# Patient Record
Sex: Male | Born: 1960
Health system: Southern US, Community
[De-identification: ages and names within clinical notes are randomized; demographics above are authoritative.]

## PROBLEM LIST (undated history)

## (undated) DIAGNOSIS — C61 Malignant neoplasm of prostate: Secondary | ICD-10-CM

## (undated) DIAGNOSIS — E785 Hyperlipidemia, unspecified: Secondary | ICD-10-CM

## (undated) DIAGNOSIS — K5730 Diverticulosis of large intestine without perforation or abscess without bleeding: Secondary | ICD-10-CM

## (undated) DIAGNOSIS — Z8709 Personal history of other diseases of the respiratory system: Secondary | ICD-10-CM

## (undated) DIAGNOSIS — E039 Hypothyroidism, unspecified: Secondary | ICD-10-CM

## (undated) DIAGNOSIS — Z860101 Personal history of adenomatous and serrated colon polyps: Secondary | ICD-10-CM

## (undated) DIAGNOSIS — N401 Enlarged prostate with lower urinary tract symptoms: Secondary | ICD-10-CM

## (undated) DIAGNOSIS — G4700 Insomnia, unspecified: Secondary | ICD-10-CM

## (undated) DIAGNOSIS — J449 Chronic obstructive pulmonary disease, unspecified: Secondary | ICD-10-CM

## (undated) DIAGNOSIS — Z86010 Personal history of colonic polyps: Secondary | ICD-10-CM

## (undated) DIAGNOSIS — K219 Gastro-esophageal reflux disease without esophagitis: Secondary | ICD-10-CM

## (undated) HISTORY — PX: PROSTATE BIOPSY: SHX241

## (undated) HISTORY — PX: COLONOSCOPY: SHX174

## (undated) HISTORY — DX: Malignant neoplasm of prostate: C61

## (undated) HISTORY — DX: Hypothyroidism, unspecified: E03.9

## (undated) HISTORY — DX: Hyperlipidemia, unspecified: E78.5

## (undated) HISTORY — DX: Chronic obstructive pulmonary disease, unspecified: J44.9

---

## 1987-07-31 HISTORY — PX: THORACOTOMY: SUR1349

## 2011-08-15 ENCOUNTER — Ambulatory Visit: Payer: Worker's Compensation

## 2011-08-16 ENCOUNTER — Ambulatory Visit: Payer: Worker's Compensation

## 2016-07-12 ENCOUNTER — Ambulatory Visit (INDEPENDENT_AMBULATORY_CARE_PROVIDER_SITE_OTHER): Payer: 59 | Admitting: Family Medicine

## 2016-07-12 ENCOUNTER — Encounter: Payer: Self-pay | Admitting: Family Medicine

## 2016-07-12 VITALS — BP 112/73 | HR 79 | Temp 97.8°F | Resp 14 | Ht 67.5 in | Wt 159.5 lb

## 2016-07-12 DIAGNOSIS — Z1211 Encounter for screening for malignant neoplasm of colon: Secondary | ICD-10-CM | POA: Diagnosis not present

## 2016-07-12 DIAGNOSIS — G4700 Insomnia, unspecified: Secondary | ICD-10-CM | POA: Insufficient documentation

## 2016-07-12 DIAGNOSIS — E039 Hypothyroidism, unspecified: Secondary | ICD-10-CM | POA: Diagnosis not present

## 2016-07-12 DIAGNOSIS — F5101 Primary insomnia: Secondary | ICD-10-CM

## 2016-07-12 DIAGNOSIS — Z8546 Personal history of malignant neoplasm of prostate: Secondary | ICD-10-CM | POA: Insufficient documentation

## 2016-07-12 DIAGNOSIS — Z130 Encounter for screening for diseases of the blood and blood-forming organs and certain disorders involving the immune mechanism: Secondary | ICD-10-CM

## 2016-07-12 DIAGNOSIS — R7989 Other specified abnormal findings of blood chemistry: Secondary | ICD-10-CM | POA: Diagnosis not present

## 2016-07-12 DIAGNOSIS — C61 Malignant neoplasm of prostate: Secondary | ICD-10-CM | POA: Diagnosis not present

## 2016-07-12 DIAGNOSIS — Z13220 Encounter for screening for lipoid disorders: Secondary | ICD-10-CM

## 2016-07-12 LAB — COMPREHENSIVE METABOLIC PANEL
ALK PHOS: 88 U/L (ref 39–117)
ALT: 15 U/L (ref 0–53)
AST: 16 U/L (ref 0–37)
Albumin: 4.6 g/dL (ref 3.5–5.2)
BUN: 15 mg/dL (ref 6–23)
CO2: 30 mEq/L (ref 19–32)
Calcium: 9.4 mg/dL (ref 8.4–10.5)
Chloride: 103 mEq/L (ref 96–112)
Creatinine, Ser: 1.03 mg/dL (ref 0.40–1.50)
GFR: 79.61 mL/min (ref >60.00)
GLUCOSE: 81 mg/dL (ref 70–99)
POTASSIUM: 4.3 meq/L (ref 3.5–5.1)
SODIUM: 140 meq/L (ref 135–145)
TOTAL PROTEIN: 6.8 g/dL (ref 6.0–8.3)
Total Bilirubin: 1.4 mg/dL — ABNORMAL HIGH (ref 0.2–1.2)

## 2016-07-12 LAB — LIPID PANEL
CHOL/HDL RATIO: 6
Cholesterol: 259 mg/dL — ABNORMAL HIGH (ref 0–200)
HDL: 45.3 mg/dL (ref >39.00)
NONHDL: 213.8
Triglycerides: 258 mg/dL — ABNORMAL HIGH (ref 0.0–149.0)
VLDL: 51.6 mg/dL — AB (ref 0.0–40.0)

## 2016-07-12 LAB — TSH: TSH: 2.88 u[IU]/mL (ref 0.35–4.50)

## 2016-07-12 LAB — PSA: PSA: 4.45 ng/mL — AB (ref 0.10–4.00)

## 2016-07-12 LAB — LDL CHOLESTEROL, DIRECT: Direct LDL: 173 mg/dL

## 2016-07-12 MED ORDER — MIRTAZAPINE 30 MG PO TABS: 30.0000 mg | ORAL_TABLET | Freq: Every day | ORAL | 1 refills | Status: DC

## 2016-07-12 MED ORDER — LEVOTHYROXINE SODIUM 100 MCG PO TABS: 100.0000 ug | ORAL_TABLET | Freq: Every day | ORAL | 1 refills | Status: DC

## 2016-07-12 NOTE — Assessment & Plan Note (Signed)
Stable. Labs today. Refilled Synthroid.

## 2016-07-12 NOTE — Progress Notes (Signed)
Pre visit review using our clinic review tool, if applicable. No additional management support is needed unless otherwise documented below in the visit note. 

## 2016-07-12 NOTE — Assessment & Plan Note (Signed)
New problem. Referring to [facility].

## 2016-07-12 NOTE — Progress Notes (Signed)
Subjective:  Patient ID: Cory [name], male    DOB: 14-Jun-1961  Age: 55 y.o. MRN: [id number]  CC: Establish care  HPI Cory [name] is a 55 y.o. male presents to the clinic today to establish care.Concerns/issues are below.  Prostate cancer  Patient has recently relocated from [place]. He was diagnosed earlier this year following an abnormal PSA.  Patient has yet to undergo treatment.  He is in need of a urologist in her area.  He would like referral today.  He states that he has some difficulty urinating but is otherwise asymptomatic.  Hypothyroidism  Has been stable on Synthroid 100 MCG daily.  He is in need of refill today.  Labs today also.  Insomnia  Stable Remeron.  Needs refill today.  PMH, Surgical Hx, Family Hx, Social History reviewed and updated as below.  Past Medical History:  Diagnosis Date  . Frequent headaches   . Hypothyroidism   . Prostate cancer [facility])    Past Surgical History:  Procedure Laterality Date  . THORACOTOMY Right 1989   Family History  Problem Relation Age of Onset  . Lung cancer Mother   . Hyperlipidemia Mother   . Hypertension Mother    Social History  Substance Use Topics  . Smoking status: Former Research scientist (life sciences)  . Smokeless tobacco: Never Used  . Alcohol use 0.6 oz/week    1 Cans of beer per week   Review of Systems  Genitourinary: Positive for difficulty urinating.  Neurological: Positive for dizziness.  All other systems reviewed and are negative.  Objective:   Today's Vitals: BP 112/73 (BP Location: Left Arm, Patient Position: Sitting, Cuff Size: Normal)   Pulse 79   Temp 97.8 F (36.6 C) (Oral)   Resp 14   Ht 5' 7.5" (1.715 m) Comment: with shoes  Wt 159 lb 8 oz (72.3 kg)   SpO2 97%   BMI 24.61 kg/m   Physical Exam  Constitutional: He is oriented to person, place, and time. He appears well-developed and well-nourished. No distress.  HENT:  Head: Normocephalic and atraumatic.  Nose: Nose normal.    Mouth/Throat: Oropharynx is clear and moist. No oropharyngeal exudate.  Normal TM's bilaterally.   Eyes: Conjunctivae are normal. No scleral icterus.  Neck: Neck supple.  Cardiovascular: Normal rate and regular rhythm.   No murmur heard. Pulmonary/Chest: Effort normal and breath sounds normal. He has no wheezes. He has no rales.  Abdominal: Soft. He exhibits no distension. There is no tenderness. There is no rebound and no guarding.  Musculoskeletal: Normal range of motion. He exhibits no edema.  Lymphadenopathy:    He has no cervical adenopathy.  Neurological: He is alert and oriented to person, place, and time.  Skin: Skin is warm and dry. No rash noted.  Psychiatric:  Flat affect.  Vitals reviewed.  Assessment & Plan:   Problem List Items Addressed This Visit    Prostate cancer ([place]) - Primary    New problem. Referring to [facility].      Relevant Medications   famciclovir (FAMVIR) 500 MG tablet   Other Relevant Orders   Ambulatory referral to Urology   Comprehensive metabolic panel   PSA   Insomnia    Stable on Remeron. Refilled today.      Hypothyroidism    Stable. Labs today. Refilled Synthroid.      Relevant Medications   levothyroxine (SYNTHROID, LEVOTHROID) 100 MCG tablet   Other Relevant Orders   TSH   Colon cancer screening  Patient states these due for colonoscopy (its been 5 years). Requesting a referral today. Referral placed.      Relevant Orders   Ambulatory referral to Gastroenterology    Other Visit Diagnoses    Screening for deficiency anemia       Relevant Orders   CBC   Lipid panel   Screening, lipid          Outpatient Encounter Prescriptions as of 07/12/2016  Medication Sig  . famciclovir (FAMVIR) 500 MG tablet Take 500 mg by mouth daily. Take when needed over the course of 7-days.  Marland Kitchen levothyroxine (SYNTHROID, LEVOTHROID) 100 MCG tablet Take 1 tablet (100 mcg total) by mouth daily before breakfast.  . mirtazapine  (REMERON) 30 MG tablet Take 1 tablet (30 mg total) by mouth at bedtime.  . [DISCONTINUED] levothyroxine (SYNTHROID, LEVOTHROID) 100 MCG tablet Take 100 mcg by mouth daily before breakfast.  . [DISCONTINUED] mirtazapine (REMERON) 30 MG tablet Take 30 mg by mouth at bedtime.   No facility-administered encounter medications on file as of 07/12/2016.     Follow-up: 6 months - 1 year.  [place]

## 2016-07-12 NOTE — Assessment & Plan Note (Signed)
Stable on Remeron. Refilled today.

## 2016-07-12 NOTE — Patient Instructions (Signed)
We will call with the referrals and with your lab results.  Follow up in 6 months to 1 year.  Take care  Dr. Lacinda Axon

## 2016-07-12 NOTE — Assessment & Plan Note (Signed)
Patient states these due for colonoscopy (its been 5 years). Requesting a referral today. Referral placed.

## 2016-07-13 LAB — CBC
HEMATOCRIT: 43.4 % (ref 39.0–52.0)
Hemoglobin: 14.8 g/dL (ref 13.0–17.0)
MCHC: 34.1 g/dL (ref 30.0–36.0)
MCV: 88.7 fl (ref 78.0–100.0)
Platelets: 251 10*3/uL (ref 150.0–400.0)
RBC: 4.89 Mil/uL (ref 4.22–5.81)
RDW: 13.8 % (ref 11.5–15.5)
WBC: 7.1 10*3/uL (ref 4.0–10.5)

## 2016-07-27 ENCOUNTER — Encounter: Payer: Self-pay | Admitting: Radiation Oncology

## 2016-08-02 ENCOUNTER — Telehealth: Payer: Self-pay | Admitting: Gastroenterology

## 2016-08-07 ENCOUNTER — Encounter: Payer: Self-pay | Admitting: Gastroenterology

## 2016-08-15 ENCOUNTER — Ambulatory Visit: Payer: 59

## 2016-08-15 ENCOUNTER — Ambulatory Visit: Payer: 59 | Admitting: Radiation Oncology

## 2016-08-20 ENCOUNTER — Ambulatory Visit
Admission: RE | Admit: 2016-08-20 | Discharge: 2016-08-20 | Disposition: A | Discharge status: Home / Self Care | Payer: 59 | Source: Ambulatory Visit | Attending: Radiation Oncology | Admitting: Radiation Oncology

## 2016-08-20 ENCOUNTER — Encounter: Payer: Self-pay | Admitting: Radiation Oncology

## 2016-08-20 VITALS — BP 125/79 | HR 86 | Temp 97.9°F | Resp 18 | Ht 67.0 in | Wt 163.6 lb

## 2016-08-20 DIAGNOSIS — Z9889 Other specified postprocedural states: Secondary | ICD-10-CM | POA: Diagnosis not present

## 2016-08-20 DIAGNOSIS — Z87891 Personal history of nicotine dependence: Secondary | ICD-10-CM | POA: Diagnosis not present

## 2016-08-20 DIAGNOSIS — R3911 Hesitancy of micturition: Secondary | ICD-10-CM | POA: Diagnosis not present

## 2016-08-20 DIAGNOSIS — Z809 Family history of malignant neoplasm, unspecified: Secondary | ICD-10-CM | POA: Diagnosis not present

## 2016-08-20 DIAGNOSIS — Z510 Encounter for antineoplastic radiation therapy: Secondary | ICD-10-CM | POA: Insufficient documentation

## 2016-08-20 DIAGNOSIS — Z8249 Family history of ischemic heart disease and other diseases of the circulatory system: Secondary | ICD-10-CM | POA: Diagnosis not present

## 2016-08-20 DIAGNOSIS — C61 Malignant neoplasm of prostate: Secondary | ICD-10-CM

## 2016-08-20 DIAGNOSIS — E039 Hypothyroidism, unspecified: Secondary | ICD-10-CM | POA: Insufficient documentation

## 2016-08-20 DIAGNOSIS — Z801 Family history of malignant neoplasm of trachea, bronchus and lung: Secondary | ICD-10-CM | POA: Diagnosis not present

## 2016-08-20 NOTE — Progress Notes (Signed)
GU Location of Tumor / Histology: Prostatic adenocarcinoma  If Prostate Cancer, Gleason Score is (3 + 4) and PSA is (5.56)  Lajean Saver was referred by Dr. Lacinda Axon, new PCP, to Dr. Tresa Moore. Patient was counseled by Dr. Glenford Peers with [facility] in [place] following his 01/2016 biopsy and has even been seen by a radiation oncologist. Patient is new to [facility] area and is in the process of establishing medical care.  Biopsies of prostate (if applicable) revealed:    Past/Anticipated interventions by urology, if any: biopsy and counseling. Patient most interested in seeds. Patient certain he doesn't want his prostate removed.  Past/Anticipated interventions by medical oncology, if any: no  Weight changes, if any: no  Bowel/Bladder complaints, if any: hard to postpone urination. IPSS 16. Denies dysuria, hematuria, or leakage.  Nausea/Vomiting, if any: no  Pain issues, if any:  no  SAFETY ISSUES:  Prior radiation? no  Pacemaker/ICD? no  Possible current pregnancy? no  Is the patient on methotrexate? no  Current Complaints / other details:  56 year old male. Legally separated. Prostate volume 44 cc.

## 2016-08-20 NOTE — Progress Notes (Signed)
Radiation Oncology         [phone] ________________________________  Initial Outpatient Consultation  Name: Cory [name] MRN: [id number]  Date: 08/20/2016  DOB: [birth year]  [id number] Bing Neighbors, DO  Alexis Frock, MD   REFERRING PHYSICIAN: Alexis Frock, MD  DIAGNOSIS: 56 y.o. gentleman with stage T2a adenocarcinoma of the prostate with a Gleason's score of 3+4 and a PSA of 5.56    ICD-9-CM ICD-10-CM   1. Prostate cancer ([place]) [place] is a 56 y.o. gentleman.  He was noted to have an elevated PSA of 5.56 by , Dr. Wallace Going with [facility] in [place]. The patient proceeded to transrectal ultrasound with 12 biopsies of the prostate on 01/30/16 in [place].  The prostate volume measured 44 cc.  Out of 12 core biopsies,4 were positive.  The maximum Gleason score was 3+4, and this was seen in right mid, right lateral mid, right apex, and right lateral apex. He has since moved, and his new PCP is Dr. Lacinda Axon. Accordingly, he was referred for evaluation in urology by Dr. Tresa Moore on 07/26/16,  digital rectal examination was performed at that time revealing nodularity in the right apex of the prostate.    The patient reviewed the biopsy results with his urologist and he has kindly been referred today for discussion of potential radiation treatment options.   PREVIOUS RADIATION THERAPY: No  PAST MEDICAL HISTORY:  has a past medical history of Hypothyroidism and Prostate cancer ([place]).    PAST SURGICAL HISTORY: Past Surgical History:  Procedure Laterality Date  . PROSTATE BIOPSY    . THORACOTOMY Right 1989    FAMILY HISTORY: family history includes Cancer in his sister; Hyperlipidemia in his mother; Hypertension in his mother; Lung cancer in his mother.  SOCIAL HISTORY:  reports that he quit smoking about 3 years ago. His smoking use included Cigarettes. He has a 35.00 pack-year smoking history. He has never used smokeless tobacco. He  reports that he drinks about 0.6 oz of alcohol per week . He reports that he does not use drugs.  ALLERGIES: Patient has no known allergies.  MEDICATIONS:  Current Outpatient Prescriptions  Medication Sig Dispense Refill  . levothyroxine (SYNTHROID, LEVOTHROID) 100 MCG tablet Take 1 tablet (100 mcg total) by mouth daily before breakfast. 90 tablet 1  . mirtazapine (REMERON) 30 MG tablet Take 1 tablet (30 mg total) by mouth at bedtime. 90 tablet 1  . famciclovir (FAMVIR) 500 MG tablet Take 500 mg by mouth daily. Take when needed over the course of 7-days.     No current facility-administered medications for this encounter.     REVIEW OF SYSTEMS:  A 15 point review of systems is documented in the electronic medical record. This was obtained by the nursing staff. However, I reviewed this with the patient to discuss relevant findings and make appropriate changes.  Pertinent items are noted in HPI.Marland Kitchen He denies weight changes, nausea/vomiting, or pain at this time. The patient completed an IPSS and IIEF questionnaire.  His IPSS score was 16 indicating moderate urinary outflow obstructive symptoms.  He notes difficulty postponing urination. He denies dysuria, hematuria, or leakage. He indicated that his erectile function is able to complete sexual activity most of the time.   PHYSICAL EXAM: This patient is in no acute distress.  He is alert and oriented.   height is 5\' 7"  (1.702 m) and weight is 163 lb 9.6 oz (74.2 kg). His oral temperature is  97.9 F (36.6 C). His blood pressure is 125/79 and his pulse is 86. His respiration is 18 and oxygen saturation is 100%.  He exhibits no respiratory distress or labored breathing.  He appears neurologically intact.  His mood is pleasant.  His affect is appropriate.  Please note the digital rectal exam findings described above.  KPS = 100  100 - Normal; no complaints; no evidence of disease. 90   - Able to carry on normal activity; minor signs or symptoms of  disease. 80   - Normal activity with effort; some signs or symptoms of disease. 10   - Cares for self; unable to carry on normal activity or to do active work. 60   - Requires occasional assistance, but is able to care for most of his personal needs. 50   - Requires considerable assistance and frequent medical care. 56   - Disabled; requires special care and assistance. 35   - Severely disabled; hospital admission is indicated although death not imminent. 36   - Very sick; hospital admission necessary; active supportive treatment necessary. 10   - Moribund; fatal processes progressing rapidly. 0     - Dead  Karnofsky DA, Abelmann [place], Craver LS and Burchenal [facility] [phone]) The use of the nitrogen mustards in the palliative treatment of carcinoma: with particular reference to bronchogenic carcinoma Cancer 1 634-56   LABORATORY DATA:  Lab Results  Component Value Date   WBC 7.1 07/12/2016   HGB 14.8 07/12/2016   HCT 43.4 07/12/2016   MCV 88.7 07/12/2016   PLT 251.0 07/12/2016   Lab Results  Component Value Date   NA 140 07/12/2016   K 4.3 07/12/2016   CL 103 07/12/2016   CO2 30 07/12/2016   Lab Results  Component Value Date   ALT 15 07/12/2016   AST 16 07/12/2016   ALKPHOS 88 07/12/2016   BILITOT 1.4 (H) 07/12/2016     RADIOGRAPHY: No results found.    IMPRESSION: This gentleman is 56 years old with stage T2a adenocarcinoma of the prostate with a Gleason's score of 3+4 and a PSA of 5.56.  His T-Stage, Gleason's Score, and PSA put him into the intermediate risk group.   He falls into a select sub-set of patients with intermediate risk disease who are eligible for seed implant with primary Gleason grade of 3 and less than half of one lobe positive for Gleason's 7 disease.  Accordingly he is eligible for a variety of potential treatment options including prostatectomy, external beam radiation, or external beam radiation.  PLAN:Today I reviewed the findings and workup thus far.  We  discussed the natural history of prostate cancer.  We reviewed the the implications of T-stage, Gleason's Score, and PSA on decision-making and outcomes in prostate cancer.  We discussed radiation treatment in the management of prostate cancer with regard to the logistics and delivery of external beam radiation treatment as well as the logistics and delivery of prostate brachytherapy.  We compared and contrasted each of these approaches and also compared these against prostatectomy.  The patient expressed interest in prostate brachytherapy.    The patient would like to proceed with prostate brachytherapy.  I will share my findings with Dr. Tresa Moore and move forward with scheduling the procedure in the near future.     I enjoyed meeting with him today, and will look forward to participating in the care of this very nice gentleman.  I spent 40 minutes face to face with the patient and more  than 50% of that time was spent in counseling and/or coordination of care.   ------------------------------------------------  Cory [name], M.D.  This document serves as a record of services personally performed by Tyler Pita, MD. It was created on his behalf by Bethann Humble, a trained medical scribe. The creation of this record is based on the scribe's personal observations and the provider's statements to them. This document has been checked and approved by the attending provider.

## 2016-08-20 NOTE — Progress Notes (Signed)
See progress note under physician encounter. 

## 2016-08-22 ENCOUNTER — Telehealth: Payer: Self-pay | Admitting: *Deleted

## 2016-08-22 NOTE — Telephone Encounter (Signed)
Called patient to inform of pre-seed appt. on 08-31-16, spoke with patient and he is aware of these appts.

## 2016-08-30 ENCOUNTER — Telehealth: Payer: Self-pay | Admitting: *Deleted

## 2016-08-30 DIAGNOSIS — C61 Malignant neoplasm of prostate: Secondary | ICD-10-CM

## 2016-08-30 HISTORY — DX: Malignant neoplasm of prostate: C61

## 2016-08-30 NOTE — Telephone Encounter (Signed)
Called patient to remind of pre-seed appts. And his implant, spoke with patient and he is aware of these appts.

## 2016-08-31 ENCOUNTER — Ambulatory Visit
Admission: RE | Admit: 2016-08-31 | Discharge: 2016-08-31 | Disposition: A | Discharge status: Home / Self Care | Payer: 59 | Source: Ambulatory Visit | Attending: Radiation Oncology | Admitting: Radiation Oncology

## 2016-08-31 ENCOUNTER — Encounter (HOSPITAL_BASED_OUTPATIENT_CLINIC_OR_DEPARTMENT_OTHER)
Admission: RE | Admit: 2016-08-31 | Discharge: 2016-08-31 | Disposition: A | Discharge status: Home / Self Care | Payer: 59 | Source: Ambulatory Visit | Attending: Urology | Admitting: Urology

## 2016-08-31 ENCOUNTER — Encounter: Payer: Self-pay | Admitting: Medical Oncology

## 2016-08-31 ENCOUNTER — Ambulatory Visit (HOSPITAL_BASED_OUTPATIENT_CLINIC_OR_DEPARTMENT_OTHER)
Admission: RE | Admit: 2016-08-31 | Discharge: 2016-08-31 | Disposition: A | Discharge status: Home / Self Care | Payer: 59 | Source: Ambulatory Visit | Attending: Urology | Admitting: Urology

## 2016-08-31 DIAGNOSIS — Z01818 Encounter for other preprocedural examination: Secondary | ICD-10-CM | POA: Diagnosis not present

## 2016-08-31 DIAGNOSIS — J984 Other disorders of lung: Secondary | ICD-10-CM | POA: Insufficient documentation

## 2016-08-31 DIAGNOSIS — C61 Malignant neoplasm of prostate: Secondary | ICD-10-CM

## 2016-08-31 DIAGNOSIS — Z510 Encounter for antineoplastic radiation therapy: Secondary | ICD-10-CM | POA: Diagnosis not present

## 2016-08-31 NOTE — Progress Notes (Signed)
Introduced myself as prostate navigator and my role. I gave him my business card and asked him to call me with any questions or concerns. He saw Dr. Tammi Klippel for consult 08/20/16 and chose brachytherapy for treatment. He is scheduled for seed implant 10/18/16.

## 2016-08-31 NOTE — Progress Notes (Signed)
  Radiation Oncology         [phone] ________________________________  Name: Cory [name] MRN: [id number]  Date: 08/31/2016  DOB: [birth year]  SIMULATION AND TREATMENT PLANNING NOTE PUBIC ARCH STUDY  [id number] Bing Neighbors, DO  Alexis Frock, MD  DIAGNOSIS: 56 y.o. gentleman with stage T2a adenocarcinoma of the prostate with a Gleason's score of 3+4 and a PSA of 5.56   No diagnosis found.  COMPLEX SIMULATION:  The patient presented today for evaluation for possible prostate seed implant. He was brought to the radiation planning suite and placed supine on the CT couch. A 3-dimensional image study set was obtained in upload to the planning computer. There, on each axial slice, I contoured the prostate gland. Then, using three-dimensional radiation planning tools I reconstructed the prostate in view of the structures from the transperineal needle pathway to assess for possible pubic arch interference. In doing so, I did not appreciate any pubic arch interference. Also, the patient's prostate volume was estimated based on the drawn structure. The volume was 36 cc.  Their transrectal ultrasound volume was 44 cc. Given the pubic arch appearance and prostate volume, patient remains a good candidate to proceed with prostate seed implant. Today, he freely provided informed written consent to proceed.    PLAN: The patient will undergo prostate seed implant.   Sheral Apley Tammi Klippel, M.D.  and  Freeman Caldron PA-C     This document serves as a record of services personally performed by Tyler Pita, MD. It was created on his behalf by Arlyce Harman, a trained medical scribe. The creation of this record is based on the scribe's personal observations and the provider's statements to them. This document has been checked and approved by the attending provider.

## 2016-09-03 ENCOUNTER — Other Ambulatory Visit: Payer: Self-pay | Admitting: Urology

## 2016-09-20 ENCOUNTER — Encounter: Payer: Self-pay | Admitting: Family Medicine

## 2016-09-21 ENCOUNTER — Other Ambulatory Visit: Payer: Self-pay | Admitting: Family Medicine

## 2016-09-21 MED ORDER — FAMCICLOVIR 500 MG PO TABS: 500.0000 mg | ORAL_TABLET | Freq: Two times a day (BID) | ORAL | 0 refills | Status: DC

## 2016-09-25 ENCOUNTER — Telehealth: Payer: Self-pay

## 2016-09-25 ENCOUNTER — Ambulatory Visit (AMBULATORY_SURGERY_CENTER): Payer: Self-pay

## 2016-09-25 VITALS — Ht 67.0 in | Wt 162.4 lb

## 2016-09-25 DIAGNOSIS — Z86010 Personal history of colonic polyps: Secondary | ICD-10-CM

## 2016-09-25 MED ORDER — NA SULFATE-K SULFATE-MG SULF 17.5-3.13-1.6 GM/177ML PO SOLN: ORAL | 0 refills | Status: DC

## 2016-09-25 NOTE — Telephone Encounter (Signed)
No that's fine I would direct book. It's better to have the colonoscopy done prior to his therapy, should work out better. Thanks

## 2016-09-25 NOTE — Telephone Encounter (Signed)
Dr Cory [name], This pt is scheduled for his colon on 10/09/16. He has a history of prostate cancer which is newly diagnosed. He is scheduled for radioactive seed placement on 10/19/16. Does pt need an OV first with you or is a direct colon ok? Please advise. Thanks

## 2016-09-25 NOTE — Progress Notes (Signed)
Per pt, no allergies to soy or egg products.Pt not taking any weight loss meds or using  O2 at home. 

## 2016-09-25 NOTE — Telephone Encounter (Signed)
Spoke with pt and informed him it was OK to proceed with the colon on 10/09/16 per Dr Havery Moros. Pt was advised to call if he had further questions.

## 2016-09-26 ENCOUNTER — Encounter: Payer: Self-pay | Admitting: Gastroenterology

## 2016-10-09 ENCOUNTER — Encounter: Payer: Self-pay | Admitting: Gastroenterology

## 2016-10-09 ENCOUNTER — Ambulatory Visit (AMBULATORY_SURGERY_CENTER): Payer: 59 | Admitting: Gastroenterology

## 2016-10-09 VITALS — BP 129/81 | HR 62 | Temp 96.8°F | Resp 13 | Ht 67.0 in | Wt 162.0 lb

## 2016-10-09 DIAGNOSIS — D126 Benign neoplasm of colon, unspecified: Secondary | ICD-10-CM | POA: Diagnosis not present

## 2016-10-09 DIAGNOSIS — D123 Benign neoplasm of transverse colon: Secondary | ICD-10-CM

## 2016-10-09 DIAGNOSIS — Z1211 Encounter for screening for malignant neoplasm of colon: Secondary | ICD-10-CM | POA: Diagnosis not present

## 2016-10-09 DIAGNOSIS — Z86010 Personal history of colonic polyps: Secondary | ICD-10-CM | POA: Diagnosis not present

## 2016-10-09 DIAGNOSIS — D127 Benign neoplasm of rectosigmoid junction: Secondary | ICD-10-CM

## 2016-10-09 DIAGNOSIS — K635 Polyp of colon: Secondary | ICD-10-CM | POA: Diagnosis not present

## 2016-10-09 MED ORDER — SODIUM CHLORIDE 0.9 % IV SOLN: 500.0000 mL | INTRAVENOUS | Status: DC

## 2016-10-09 NOTE — Progress Notes (Signed)
A and O x3. Report to RN. Tolerated MAC anesthesia well.

## 2016-10-09 NOTE — Op Note (Signed)
[place] Patient Name: Cory [name] Procedure Date: 10/09/2016 9:02 AM MRN: [id number] Endoscopist: Remo Lipps P. Jolynn Bajorek MD, MD Age: 56 Referring MD:  Date of Birth: January 13, 1961 Gender: Male Account #: [id number] Procedure:                Colonoscopy Indications:              Surveillance: Personal history of adenomatous                            polyps on last colonoscopy > 5 years ago Medicines:                Monitored Anesthesia Care Procedure:                Pre-Anesthesia Assessment:                           - Prior to the procedure, a History and Physical                            was performed, and patient medications and                            allergies were reviewed. The patient's tolerance of                            previous anesthesia was also reviewed. The risks                            and benefits of the procedure and the sedation                            options and risks were discussed with the patient.                            All questions were answered, and informed consent                            was obtained. Prior Anticoagulants: The patient has                            taken no previous anticoagulant or antiplatelet                            agents. ASA Grade Assessment: II - A patient with                            mild systemic disease. After reviewing the risks                            and benefits, the patient was deemed in                            satisfactory condition to undergo the procedure.  After obtaining informed consent, the colonoscope                            was passed under direct vision. Throughout the                            procedure, the patient's blood pressure, pulse, and                            oxygen saturations were monitored continuously. The                            Colonoscope was introduced through the anus and                            advanced to the the  cecum, identified by                            appendiceal orifice and ileocecal valve. The                            colonoscopy was performed without difficulty. The                            patient tolerated the procedure well. The quality                            of the bowel preparation was good. The ileocecal                            valve, appendiceal orifice, and rectum were                            photographed. Scope In: 9:04:54 AM Scope Out: 9:23:09 AM Scope Withdrawal Time: 0 hours 16 minutes 25 seconds  Total Procedure Duration: 0 hours 18 minutes 15 seconds  Findings:                 The perianal and digital rectal examinations were                            normal.                           A 4 mm polyp was found in the transverse colon. The                            polyp was sessile. The polyp was removed with a                            cold snare. Resection and retrieval were complete.                           A 5 mm polyp was found in the recto-sigmoid colon.  The polyp was flat. The polyp was removed with a                            cold snare. Resection and retrieval were complete.                           Multiple medium-mouthed diverticula were found in                            the left colon.                           Internal hemorrhoids were found during                            retroflexion. The hemorrhoids were small.                           The exam was otherwise without abnormality. Complications:            No immediate complications. Estimated blood loss:                            Minimal. Estimated Blood Loss:     Estimated blood loss was minimal. Impression:               - One 4 mm polyp in the transverse colon, removed                            with a cold snare. Resected and retrieved.                           - One 5 mm polyp at the recto-sigmoid colon,                            removed with a  cold snare. Resected and retrieved.                           - Diverticulosis in the left colon.                           - Internal hemorrhoids.                           - The examination was otherwise normal. Recommendation:           - Patient has a contact number available for                            emergencies. The signs and symptoms of potential                            delayed complications were discussed with the                            patient. Return to normal activities  tomorrow.                            Written discharge instructions were provided to the                            patient.                           - Resume previous diet.                           - Continue present medications.                           - No ibuprofen, naproxen, or other non-steroidal                            anti-inflammatory drugs for 2 weeks after polyp                            removal.                           - Await pathology results.                           - Repeat colonoscopy is recommended for                            surveillance. The colonoscopy date will be                            determined after pathology results from today's                            exam become available for review. Remo Lipps P. Wileen Duncanson MD, MD 10/09/2016 9:27:08 AM This report has been signed electronically.

## 2016-10-09 NOTE — Progress Notes (Signed)
Called to room to assist during endoscopic procedure.  Patient ID and intended procedure confirmed with present staff. Received instructions for my participation in the procedure from the performing physician.  

## 2016-10-09 NOTE — Patient Instructions (Signed)
Handouts given on diverticulosis, hemorrhoids, and polyps   YOU HAD AN ENDOSCOPIC PROCEDURE TODAY: Refer to the procedure report and other information in the discharge instructions given to you for any specific questions about what was found during the examination. If this information does not answer your questions, please call [facility] office at [phone] to clarify.   YOU SHOULD EXPECT: Some feelings of bloating in the abdomen. Passage of more gas than usual. Walking can help get rid of the air that was put into your GI tract during the procedure and reduce the bloating. If you had a lower endoscopy (such as a colonoscopy or flexible sigmoidoscopy) you may notice spotting of blood in your stool or on the toilet paper. Some abdominal soreness may be present for a day or two, also.  DIET: Your first meal following the procedure should be a light meal and then it is ok to progress to your normal diet. A half-sandwich or bowl of soup is an example of a good first meal. Heavy or fried foods are harder to digest and may make you feel nauseous or bloated. Drink plenty of fluids but you should avoid alcoholic beverages for 24 hours. If you had a esophageal dilation, please see attached instructions for diet.    ACTIVITY: Your care partner should take you home directly after the procedure. You should plan to take it easy, moving slowly for the rest of the day. You can resume normal activity the day after the procedure however YOU SHOULD NOT DRIVE, use power tools, machinery or perform tasks that involve climbing or major physical exertion for 24 hours (because of the sedation medicines used during the test).   SYMPTOMS TO REPORT IMMEDIATELY: A gastroenterologist can be reached at any hour. Please call [phone]  for any of the following symptoms:  Following lower endoscopy (colonoscopy, flexible sigmoidoscopy) Excessive amounts of blood in the stool  Significant tenderness, worsening of abdominal pains   Swelling of the abdomen that is new, acute  Fever of 100 or higher   FOLLOW UP:  If any biopsies were taken you will be contacted by phone or by letter within the next 1-3 weeks. Call [phone]  if you have not heard about the biopsies in 3 weeks.  Please also call with any specific questions about appointments or follow up tests.

## 2016-10-10 ENCOUNTER — Telehealth: Payer: Self-pay | Admitting: *Deleted

## 2016-10-10 ENCOUNTER — Telehealth: Payer: Self-pay

## 2016-10-10 NOTE — Telephone Encounter (Signed)
  Follow up Call-  Call back number 10/09/2016  Post procedure Call Back phone  # [phone]  Permission to leave phone message Yes  Some recent data might be hidden   Left message

## 2016-10-10 NOTE — Telephone Encounter (Signed)
  Follow up Call-  Call back number 10/09/2016  Post procedure Call Back phone  # [phone]  Permission to leave phone message Yes  Some recent data might be hidden     Patient questions:  Do you have a fever, pain , or abdominal swelling? No. Pain Score  0 *  Have you tolerated food without any problems? Yes.    Have you been able to return to your normal activities? Yes.    Do you have any questions about your discharge instructions: Diet   No. Medications  No. Follow up visit  No.  Do you have questions or concerns about your Care? No.  Actions: * If pain score is 4 or above: No action needed, pain <4.

## 2016-10-11 ENCOUNTER — Telehealth: Payer: Self-pay | Admitting: *Deleted

## 2016-10-11 NOTE — Telephone Encounter (Signed)
Called patient to remind of labs for 10-12-16 for implant on 10-19-16, spoke with patient and he is aware of this appt.

## 2016-10-12 ENCOUNTER — Encounter (HOSPITAL_BASED_OUTPATIENT_CLINIC_OR_DEPARTMENT_OTHER): Payer: Self-pay | Admitting: *Deleted

## 2016-10-12 DIAGNOSIS — Z87891 Personal history of nicotine dependence: Secondary | ICD-10-CM | POA: Diagnosis not present

## 2016-10-12 DIAGNOSIS — Z8546 Personal history of malignant neoplasm of prostate: Secondary | ICD-10-CM | POA: Diagnosis not present

## 2016-10-12 DIAGNOSIS — R972 Elevated prostate specific antigen [PSA]: Secondary | ICD-10-CM | POA: Diagnosis not present

## 2016-10-12 LAB — COMPREHENSIVE METABOLIC PANEL
ALBUMIN: 4.4 g/dL (ref 3.5–5.0)
ALT: 14 U/L — ABNORMAL LOW (ref 17–63)
ANION GAP: 5 (ref 5–15)
AST: 18 U/L (ref 15–41)
Alkaline Phosphatase: 76 U/L (ref 38–126)
BUN: 15 mg/dL (ref 6–20)
CO2: 25 mmol/L (ref 22–32)
Calcium: 9.1 mg/dL (ref 8.9–10.3)
Chloride: 108 mmol/L (ref 101–111)
Creatinine, Ser: 1.1 mg/dL (ref 0.61–1.24)
GFR calc Af Amer: >60 mL/min (ref >60)
GFR calc non Af Amer: >60 mL/min (ref >60)
GLUCOSE: 87 mg/dL (ref 65–99)
POTASSIUM: 4 mmol/L (ref 3.5–5.1)
SODIUM: 138 mmol/L (ref 135–145)
TOTAL PROTEIN: 7.3 g/dL (ref 6.5–8.1)
Total Bilirubin: 1.2 mg/dL (ref 0.3–1.2)

## 2016-10-12 LAB — CBC
HCT: 41.9 % (ref 39.0–52.0)
Hemoglobin: 14.4 g/dL (ref 13.0–17.0)
MCH: 30.1 pg (ref 26.0–34.0)
MCHC: 34.4 g/dL (ref 30.0–36.0)
MCV: 87.5 fL (ref 78.0–100.0)
Platelets: 250 10*3/uL (ref 150–400)
RBC: 4.79 MIL/uL (ref 4.22–5.81)
RDW: 13.4 % (ref 11.5–15.5)
WBC: 5.7 10*3/uL (ref 4.0–10.5)

## 2016-10-12 LAB — PROTIME-INR
INR: 0.98
Prothrombin Time: 13 seconds (ref 11.4–15.2)

## 2016-10-12 LAB — APTT: aPTT: 31 seconds (ref 24–36)

## 2016-10-12 NOTE — Progress Notes (Signed)
NPO AFTER MN WITH EXCEPTION CLEAR LIQUIDS UNTIL 0800 (NO CREAM/ MILK PRODUCTS).  ARRIVE AT 1230. LAB WORK DONE TODAY.  CURRENT CXR AND EKG IN CHART AND EPIC.  WILL TAKE DO FLEET ENEMA AM DOS.

## 2016-10-15 ENCOUNTER — Encounter: Payer: Self-pay | Admitting: Gastroenterology

## 2016-10-18 ENCOUNTER — Telehealth: Payer: Self-pay | Admitting: *Deleted

## 2016-10-18 NOTE — Telephone Encounter (Signed)
CALLED PATIENT TO REMIND OF IMPLANT FOR 10-19-16, SPOKE WITH PATIENT AND HE IS AWARE OF THIS IMPLANT

## 2016-10-19 ENCOUNTER — Ambulatory Visit (HOSPITAL_COMMUNITY): Payer: 59

## 2016-10-19 ENCOUNTER — Other Ambulatory Visit: Payer: Self-pay | Admitting: Family Medicine

## 2016-10-19 ENCOUNTER — Ambulatory Visit (HOSPITAL_BASED_OUTPATIENT_CLINIC_OR_DEPARTMENT_OTHER)
Admission: RE | Admit: 2016-10-19 | Discharge: 2016-10-19 | Disposition: A | Discharge status: Home / Self Care | Payer: 59 | Source: Ambulatory Visit | Attending: Urology | Admitting: Urology

## 2016-10-19 ENCOUNTER — Encounter (HOSPITAL_BASED_OUTPATIENT_CLINIC_OR_DEPARTMENT_OTHER)
Admission: RE | Disposition: A | Discharge status: Home / Self Care | Payer: Self-pay | Source: Ambulatory Visit | Attending: Urology

## 2016-10-19 ENCOUNTER — Ambulatory Visit (HOSPITAL_BASED_OUTPATIENT_CLINIC_OR_DEPARTMENT_OTHER): Payer: 59 | Admitting: Certified Registered"

## 2016-10-19 ENCOUNTER — Encounter (HOSPITAL_BASED_OUTPATIENT_CLINIC_OR_DEPARTMENT_OTHER): Payer: Self-pay

## 2016-10-19 DIAGNOSIS — Z87891 Personal history of nicotine dependence: Secondary | ICD-10-CM | POA: Insufficient documentation

## 2016-10-19 DIAGNOSIS — R972 Elevated prostate specific antigen [PSA]: Secondary | ICD-10-CM | POA: Insufficient documentation

## 2016-10-19 DIAGNOSIS — Z01818 Encounter for other preprocedural examination: Secondary | ICD-10-CM

## 2016-10-19 DIAGNOSIS — Z8546 Personal history of malignant neoplasm of prostate: Secondary | ICD-10-CM | POA: Insufficient documentation

## 2016-10-19 HISTORY — DX: Benign prostatic hyperplasia with lower urinary tract symptoms: N40.1

## 2016-10-19 HISTORY — PX: CYSTOSCOPY: SHX5120

## 2016-10-19 HISTORY — PX: RADIOACTIVE SEED IMPLANT: SHX5150

## 2016-10-19 HISTORY — DX: Personal history of adenomatous and serrated colon polyps: Z86.0101

## 2016-10-19 HISTORY — DX: Insomnia, unspecified: G47.00

## 2016-10-19 HISTORY — DX: Personal history of other diseases of the respiratory system: Z87.09

## 2016-10-19 HISTORY — DX: Diverticulosis of large intestine without perforation or abscess without bleeding: K57.30

## 2016-10-19 HISTORY — DX: Personal history of colonic polyps: Z86.010

## 2016-10-19 SURGERY — INSERTION, RADIATION SOURCE, PROSTATE
Anesthesia: General | Site: Prostate

## 2016-10-19 MED ORDER — GENTAMICIN SULFATE 40 MG/ML IJ SOLN
80.0000 mg | Freq: Once | INTRAVENOUS | Status: DC
  Filled 2016-10-19: qty 2

## 2016-10-19 MED ORDER — FENTANYL CITRATE (PF) 100 MCG/2ML IJ SOLN
INTRAMUSCULAR | Status: DC | PRN
  Administered 2016-10-19: 50 ug via INTRAVENOUS
  Administered 2016-10-19 (×3): 25 ug via INTRAVENOUS

## 2016-10-19 MED ORDER — FENTANYL CITRATE (PF) 100 MCG/2ML IJ SOLN
INTRAMUSCULAR | Status: AC
  Filled 2016-10-19: qty 2

## 2016-10-19 MED ORDER — TRAMADOL HCL 50 MG PO TABS: 50.0000 mg | ORAL_TABLET | Freq: Four times a day (QID) | ORAL | 0 refills | Status: DC | PRN

## 2016-10-19 MED ORDER — ONDANSETRON HCL 4 MG/2ML IJ SOLN
INTRAMUSCULAR | Status: DC | PRN
  Administered 2016-10-19: 4 mg via INTRAVENOUS

## 2016-10-19 MED ORDER — IOHEXOL 300 MG/ML  SOLN
INTRAMUSCULAR | Status: DC | PRN
  Administered 2016-10-19: 7 mL

## 2016-10-19 MED ORDER — SODIUM CHLORIDE 0.9 % IV SOLN
INTRAVENOUS | Status: DC | PRN
  Administered 2016-10-19: 1000 mL

## 2016-10-19 MED ORDER — DEXAMETHASONE SODIUM PHOSPHATE 4 MG/ML IJ SOLN
INTRAMUSCULAR | Status: DC | PRN
  Administered 2016-10-19: 10 mg via INTRAVENOUS

## 2016-10-19 MED ORDER — PROPOFOL 10 MG/ML IV BOLUS
INTRAVENOUS | Status: AC
  Filled 2016-10-19: qty 20

## 2016-10-19 MED ORDER — FENTANYL CITRATE (PF) 100 MCG/2ML IJ SOLN
25.0000 ug | INTRAMUSCULAR | Status: DC | PRN
  Administered 2016-10-19: 50 ug via INTRAVENOUS
  Filled 2016-10-19: qty 1

## 2016-10-19 MED ORDER — TAMSULOSIN HCL 0.4 MG PO CAPS: 0.4000 mg | ORAL_CAPSULE | Freq: Every day | ORAL | 11 refills | Status: DC | PRN

## 2016-10-19 MED ORDER — TAMSULOSIN HCL 0.4 MG PO CAPS
0.4000 mg | ORAL_CAPSULE | Freq: Every day | ORAL | Status: DC
  Administered 2016-10-19: 0.4 mg via ORAL
  Filled 2016-10-19: qty 1

## 2016-10-19 MED ORDER — MIDAZOLAM HCL 2 MG/2ML IJ SOLN
INTRAMUSCULAR | Status: AC
Start: 2016-10-19 — End: 2016-10-19
  Filled 2016-10-19: qty 2

## 2016-10-19 MED ORDER — LACTATED RINGERS IV SOLN
INTRAVENOUS | Status: DC
  Administered 2016-10-19 (×2): via INTRAVENOUS
  Filled 2016-10-19: qty 1000

## 2016-10-19 MED ORDER — LIDOCAINE 2% (20 MG/ML) 5 ML SYRINGE
INTRAMUSCULAR | Status: DC | PRN
  Administered 2016-10-19: 80 mg via INTRAVENOUS

## 2016-10-19 MED ORDER — MIDAZOLAM HCL 5 MG/5ML IJ SOLN
INTRAMUSCULAR | Status: DC | PRN
  Administered 2016-10-19: 2 mg via INTRAVENOUS

## 2016-10-19 MED ORDER — PROPOFOL 10 MG/ML IV BOLUS
INTRAVENOUS | Status: DC | PRN
  Administered 2016-10-19: 200 mg via INTRAVENOUS

## 2016-10-19 MED ORDER — SENNOSIDES-DOCUSATE SODIUM 8.6-50 MG PO TABS: 1.0000 | ORAL_TABLET | Freq: Two times a day (BID) | ORAL | 0 refills | Status: DC

## 2016-10-19 MED ORDER — LIDOCAINE 2% (20 MG/ML) 5 ML SYRINGE
INTRAMUSCULAR | Status: AC
  Filled 2016-10-19: qty 5

## 2016-10-19 MED ORDER — GENTAMICIN SULFATE 40 MG/ML IJ SOLN
330.0000 mg | Freq: Once | INTRAVENOUS | Status: AC
  Administered 2016-10-19: 330 mg via INTRAVENOUS
  Filled 2016-10-19: qty 8.25

## 2016-10-19 MED ORDER — WHITE PETROLATUM GEL
Status: AC
  Filled 2016-10-19: qty 5

## 2016-10-19 MED ORDER — TRAMADOL HCL 50 MG PO TABS
50.0000 mg | ORAL_TABLET | Freq: Four times a day (QID) | ORAL | Status: DC | PRN
  Administered 2016-10-19: 50 mg via ORAL
  Filled 2016-10-19: qty 1

## 2016-10-19 MED ORDER — ONDANSETRON HCL 4 MG/2ML IJ SOLN
INTRAMUSCULAR | Status: AC
  Filled 2016-10-19: qty 2

## 2016-10-19 MED ORDER — TAMSULOSIN HCL 0.4 MG PO CAPS
ORAL_CAPSULE | ORAL | Status: AC
  Filled 2016-10-19: qty 1

## 2016-10-19 MED ORDER — TRAMADOL HCL 50 MG PO TABS
ORAL_TABLET | ORAL | Status: AC
  Filled 2016-10-19: qty 1

## 2016-10-19 MED ORDER — FLEET ENEMA 7-19 GM/118ML RE ENEM
1.0000 | ENEMA | Freq: Once | RECTAL | Status: DC
Start: 2016-10-20 — End: 2016-10-19
  Filled 2016-10-19: qty 1

## 2016-10-19 MED ORDER — DEXAMETHASONE SODIUM PHOSPHATE 10 MG/ML IJ SOLN
INTRAMUSCULAR | Status: AC
Start: 2016-10-19 — End: 2016-10-19
  Filled 2016-10-19: qty 1

## 2016-10-19 SURGICAL SUPPLY — 30 items
BAG URINE DRAINAGE (UROLOGICAL SUPPLIES) ×4 IMPLANT
BLADE CLIPPER SURG (BLADE) ×4 IMPLANT
CATH FOLEY 2WAY SLVR  5CC 16FR (CATHETERS) ×4
CATH FOLEY 2WAY SLVR 5CC 16FR (CATHETERS) ×4 IMPLANT
CATH ROBINSON RED A/P 20FR (CATHETERS) ×4 IMPLANT
CLOTH BEACON ORANGE TIMEOUT ST (SAFETY) ×4 IMPLANT
COVER BACK TABLE 60X90IN (DRAPES) ×4 IMPLANT
COVER MAYO STAND STRL (DRAPES) ×4 IMPLANT
DRSG TEGADERM 4X4.75 (GAUZE/BANDAGES/DRESSINGS) ×4 IMPLANT
DRSG TEGADERM 8X12 (GAUZE/BANDAGES/DRESSINGS) ×4 IMPLANT
GLOVE BIO SURGEON STRL SZ7.5 (GLOVE) IMPLANT
GLOVE ECLIPSE 8.0 STRL XLNG CF (GLOVE) ×20 IMPLANT
GOWN STRL REUS W/ TWL LRG LVL3 (GOWN DISPOSABLE) ×2 IMPLANT
GOWN STRL REUS W/ TWL XL LVL3 (GOWN DISPOSABLE) ×2 IMPLANT
GOWN STRL REUS W/TWL LRG LVL3 (GOWN DISPOSABLE) ×2
GOWN STRL REUS W/TWL XL LVL3 (GOWN DISPOSABLE) ×2
HOLDER FOLEY CATH W/STRAP (MISCELLANEOUS) IMPLANT
IMPL SPACEOAR SYSTEM 10ML (MISCELLANEOUS) ×2 IMPLANT
IMPLANT SPACEOAR SYSTEM 10ML (MISCELLANEOUS) ×4
IV NS 1000ML (IV SOLUTION) ×2
IV NS 1000ML BAXH (IV SOLUTION) ×2 IMPLANT
KIT RM TURNOVER CYSTO AR (KITS) ×4 IMPLANT
MANIFOLD NEPTUNE II (INSTRUMENTS) IMPLANT
Nucletron SelectSeed I-125 ×308 IMPLANT
PACK CYSTO (CUSTOM PROCEDURE TRAY) ×4 IMPLANT
SYRINGE 10CC LL (SYRINGE) ×4 IMPLANT
TUBE CONNECTING 12'X1/4 (SUCTIONS)
TUBE CONNECTING 12X1/4 (SUCTIONS) IMPLANT
UNDERPAD 30X30 INCONTINENT (UNDERPADS AND DIAPERS) ×8 IMPLANT
WATER STERILE IRR 500ML POUR (IV SOLUTION) ×4 IMPLANT

## 2016-10-19 NOTE — Interval H&P Note (Signed)
History and Physical Interval Note:  10/19/2016 2:38 PM  Cory [name]  has presented today for surgery, with the diagnosis of PROSTATE CANCER  The various methods of treatment have been discussed with the patient and family. After consideration of risks, benefits and other options for treatment, the patient has consented to  Procedure(s): RADIOACTIVE SEED IMPLANT/BRACHYTHERAPY IMPLANT SPACE OR PLACEMENT (N/A) as a surgical intervention .  The patient's history has been reviewed, patient examined, no change in status, stable for surgery.  I have reviewed the patient's chart and labs.  Questions were answered to the patient's satisfaction.     Terri Malerba

## 2016-10-19 NOTE — Progress Notes (Signed)
  Radiation Oncology         [phone] ________________________________  Name: Cory [name] MRN: [id number]  Date: 10/19/2016  DOB: 25-Dec-1960       Prostate Seed Implant  XB:WIOMB G Cook, DO  No ref. provider found  DIAGNOSIS: 56 y.o. gentleman with stage T2a adenocarcinoma of the prostate with a Gleason's score of 3+4 and a PSA of 5.56      ICD-9-CM ICD-10-CM   1. Pre-op testing V72.84 Z01.818 DG Chest 2 View     DG Chest 2 View    PROCEDURE: Insertion of radioactive I-125 seeds into the prostate gland.  RADIATION DOSE: 145 Gy, definitive therapy.  TECHNIQUE: Cory [name] was brought to the operating room with the urologist. He was placed in the dorsolithotomy position. He was catheterized and a rectal tube was inserted. The perineum was shaved, prepped and draped. The ultrasound probe was then introduced into the rectum to see the prostate gland.  TREATMENT DEVICE: A needle grid was attached to the ultrasound probe stand and anchor needles were placed.  3D PLANNING: The prostate was imaged in 3D using a sagittal sweep of the prostate probe. These images were transferred to the planning computer. There, the prostate, urethra and rectum were defined on each axial reconstructed image. Then, the software created an optimized 3D plan and a few seed positions were adjusted. The quality of the plan was reviewed using [facility] information for the target and the following two organs at risk:  Urethra and Rectum.  Then the accepted plan was uploaded to the seed Selectron afterloading unit.  PROSTATE VOLUME STUDY:  Using transrectal ultrasound the volume of the prostate was verified to be 43 cc.  SPECIAL TREATMENT PROCEDURE/SUPERVISION AND HANDLING: The Nucletron FIRST system was used to place the needles under sagittal guidance. A total of 25 needles were used to deposit 77 seeds in the prostate gland. The individual seed activity was 0.422 mCi.  COMPLEX SIMULATION: At the end of the procedure, an  anterior radiograph of the pelvis was obtained to document seed positioning and count. Cystoscopy was performed to check the urethra and bladder.  MICRODOSIMETRY: At the end of the procedure, the patient was emitting 0.08 mR/hr at 1 meter. Accordingly, he was considered safe for hospital discharge.  PLAN: The patient will return to the radiation oncology clinic for post implant CT dosimetry in three weeks.   ________________________________  Sheral Apley Tammi Klippel, M.D.

## 2016-10-19 NOTE — Brief Op Note (Signed)
10/19/2016  4:06 PM  PATIENT:  Lajean Saver  56 y.o. male  PRE-OPERATIVE DIAGNOSIS:  PROSTATE CANCER  POST-OPERATIVE DIAGNOSIS:  * No post-op diagnosis entered *  PROCEDURE:  Procedure(s) with comments: RADIOACTIVE SEED IMPLANT/BRACHYTHERAPY IMPLANT SPACE OR PLACEMENT (N/A) -  seeds implanted CYSTOSCOPY FLEXIBLE (N/A) - no seeds found in bladder  SURGEON:  Surgeon(s) and Role:    * Alexis Frock, MD - Primary    * Tyler Pita, MD  PHYSICIAN ASSISTANT:   ASSISTANTS: none   ANESTHESIA:   general  EBL:  Total I/O In: 1000 [I.V.:1000] Out: 100 [Urine:100]  BLOOD ADMINISTERED:none  DRAINS: none   LOCAL MEDICATIONS USED:  NONE  SPECIMEN:  No Specimen  DISPOSITION OF SPECIMEN:  N/A  COUNTS:  YES  TOURNIQUET:  * No tourniquets in log *  DICTATION: .Other Dictation: Dictation Number [id number]  PLAN OF CARE: Discharge to home after PACU  PATIENT DISPOSITION:  PACU - hemodynamically stable.   Delay start of Pharmacological VTE agent (>24hrs) due to surgical blood loss or risk of bleeding: yes

## 2016-10-19 NOTE — Discharge Instructions (Addendum)
1 - You may have urinary urgency (bladder spasms) and bloody urine on / off x few days.This is normal. ° °2 - Call MD or go to ER for fever >102, severe pain / nausea / vomiting not relieved by medications, or acute change in medical status ° °Radioactive Seed Implant Home Care Instructions ° ° °Activity:    Rest for the remainder of the day.  Do not drive or operate equipment today.  You may resume normal activities in a few days as instructed by your physician, without risk of harmful radiation exposure to those around you, provided you follow the time and distance precautions on the Radiation Oncology Instruction Sheet. ° ° °Meals: Drink plenty of lipuids and eat light foods, such as gelatin or soup this evening .  You may return to normal meal plan tomorrow. ° °Return °To Work: You may return to work as instructed by your physician. ° °Special °Instruction:   If any seeds are found, use tweezers to pick up seeds and place in a glass container of any kind and bring to your physician's office. ° °Call your physician if any of these symptoms occur: ° °· Persistent or heavy bleeding °· Urine stream diminishes or stops completely after catheter is removed °· Fever equal to or greater than 101 degrees F °· Cloudy urine with a strong foul odor °· Severe pain ° °You may feel some burning pain and/or hesitancy when you urinate after the catheter is removed.  These symptoms may increase over the next few weeks, but should diminish within forur to six weeks.  Applying moist heat to the lower abdomen or a hot tub bath may help relieve the pain.  If the discomfort becomes severe, please call your physician for additional medications. ° ° °Post Anesthesia Home Care Instructions ° °Activity: °Get plenty of rest for the remainder of the day. A responsible individual must stay with you for 24 hours following the procedure.  °For the next 24 hours, DO NOT: °-Drive a car °-Operate machinery °-Drink alcoholic beverages °-Take any  medication unless instructed by your physician °-Make any legal decisions or sign important papers. ° °Meals: °Start with liquid foods such as gelatin or soup. Progress to regular foods as tolerated. Avoid greasy, spicy, heavy foods. If nausea and/or vomiting occur, drink only clear liquids until the nausea and/or vomiting subsides. Call your physician if vomiting continues. ° °Special Instructions/Symptoms: °Your throat may feel dry or sore from the anesthesia or the breathing tube placed in your throat during surgery. If this causes discomfort, gargle with warm salt water. The discomfort should disappear within 24 hours. ° °If you had a scopolamine patch placed behind your ear for the management of post- operative nausea and/or vomiting: ° °1. The medication in the patch is effective for 72 hours, after which it should be removed.  Wrap patch in a tissue and discard in the trash. Wash hands thoroughly with soap and water. °2. You may remove the patch earlier than 72 hours if you experience unpleasant side effects which may include dry mouth, dizziness or visual disturbances. °3. Avoid touching the patch. Wash your hands with soap and water after contact with the patch. °  ° ° ° ° ° °

## 2016-10-19 NOTE — Anesthesia Procedure Notes (Signed)
Procedure Name: LMA Insertion Date/Time: 10/19/2016 2:40 PM Performed by: Denna Haggard D Pre-anesthesia Checklist: Patient identified, Emergency Drugs available, Suction available and Patient being monitored Patient Re-evaluated:Patient Re-evaluated prior to inductionOxygen Delivery Method: Circle system utilized Preoxygenation: Pre-oxygenation with 100% oxygen Intubation Type: IV induction Ventilation: Mask ventilation without difficulty LMA: LMA inserted LMA Size: 4.0 Number of attempts: 1 Airway Equipment and Method: Bite block Placement Confirmation: positive ETCO2 Tube secured with: Tape Dental Injury: Teeth and Oropharynx as per pre-operative assessment

## 2016-10-19 NOTE — Anesthesia Preprocedure Evaluation (Addendum)
Anesthesia Evaluation  Patient identified by MRN, date of birth, ID band Patient awake    Reviewed: Allergy & Precautions, H&P , NPO status , Patient's Chart, lab work & pertinent test results, reviewed documented beta blocker date and time   Airway Mallampati: II  TM Distance: >3 FB Neck ROM: full    Dental no notable dental hx.    Pulmonary former smoker,    Pulmonary exam normal breath sounds clear to auscultation       Cardiovascular negative cardio ROS   Rhythm:regular Rate:Normal     Neuro/Psych negative neurological ROS  negative psych ROS   GI/Hepatic negative GI ROS, Neg liver ROS,   Endo/Other  Hypothyroidism   Renal/GU negative Renal ROS  negative genitourinary   Musculoskeletal negative musculoskeletal ROS (+)   Abdominal   Peds negative pediatric ROS (+)  Hematology negative hematology ROS (+)   Anesthesia Other Findings   Reproductive/Obstetrics negative OB ROS                            Lab Results  Component Value Date   WBC 5.7 10/12/2016   HGB 14.4 10/12/2016   HCT 41.9 10/12/2016   MCV 87.5 10/12/2016   PLT 250 10/12/2016   Lab Results  Component Value Date   CREATININE 1.10 10/12/2016   BUN 15 10/12/2016   NA 138 10/12/2016   K 4.0 10/12/2016   CL 108 10/12/2016   CO2 25 10/12/2016   Lab Results  Component Value Date   INR 0.98 10/12/2016   EKG: normal sinus rhythm.  Anesthesia Physical Anesthesia Plan  ASA: II  Anesthesia Plan: General   Post-op Pain Management:    Induction: Intravenous  Airway Management Planned: LMA  Additional Equipment:   Intra-op Plan:   Post-operative Plan: Extubation in OR  Informed Consent: I have reviewed the patients History and Physical, chart, labs and discussed the procedure including the risks, benefits and alternatives for the proposed anesthesia with the patient or authorized representative who has  indicated his/her understanding and acceptance.   Dental Advisory Given  Plan Discussed with: CRNA and Surgeon  Anesthesia Plan Comments: ( )       Anesthesia Quick Evaluation

## 2016-10-19 NOTE — H&P (Signed)
Cory [name] is an 56 y.o. male.    Chief Complaint:  Pre-op Prostate Brachytherapy and Space-OAR implantation  HPI:   1 - Moderate Risk Prostate Cancer - 4 cores Gleason 3+4 = 7 disease by BX 01/2016 (RMA, RLM, RMA, RLA up to 20%) on eval PSA 5.56 by Dr. Glenford Peers with [facility] in [place]. TRUS vol unavailable. DRE 06/2016 30gm with Rt apical induration (T2a exam). He adamantly wants brachytherapy understanding risk of secondary malignancy at his young age. CT planning vol 49mL. Most recetn PSA 3's.   PMH sig for hypothyroidism, hyperlipidemia, thoracotomy for blebs (no problems since), colon polyps (on 5 year colonoscopy cycle, due for surveillance). He works as a Probation officer. His PCP is Thersa Salt DO with [facility].   Today "Cory [name]" is seen to proceed with prostate brachytherapy seed implantation and Space-OAR placement. No interval fevers.   Past Medical History:  Diagnosis Date  . Benign localized prostatic hyperplasia with lower urinary tract symptoms (LUTS)   . Diverticulosis of colon   . History of adenomatous polyp of colon    2013  . History of pneumothorax    1989--  s/p right thoractomy for bleb  . Hypothyroidism   . Insomnia   . Prostate cancer [facility]) dx 02-26-2016 via bx--  urologist-  dr Tresa Moore  oncologist-  dr Tammi Klippel   Stage T2a, Gleason 7,  PSA 5.56,  vol 44cc    Past Surgical History:  Procedure Laterality Date  . COLONOSCOPY  last one 10-09-2016  . PROSTATE BIOPSY  01/2016  . THORACOTOMY Right 1989   Bleb / pneumothorax     Family History  Problem Relation Age of Onset  . Lung cancer Mother   . Hyperlipidemia Mother   . Hypertension Mother   . Cancer Sister     thyroid/evironmental   Social History:  reports that he quit smoking about 3 years ago. His smoking use included Cigarettes. He has a 35.00 pack-year smoking history. He has never used smokeless tobacco. He reports that he drinks about 0.6 oz of alcohol  per week . He reports that he does not use drugs.  Allergies: No Known Allergies  No prescriptions prior to admission.    No results found for this or any previous visit (from the past 48 hour(s)). No results found.  Review of Systems  Constitutional: Negative.  Negative for chills and fever.  HENT: Negative.   Eyes: Negative.   Respiratory: Negative.   Cardiovascular: Negative.   Gastrointestinal: Negative.   Genitourinary: Negative.   Musculoskeletal: Negative.   Skin: Negative.   Neurological: Negative.   Endo/Heme/Allergies: Negative.   Psychiatric/Behavioral: Negative.     There were no vitals taken for this visit. Physical Exam  Constitutional: He is oriented to person, place, and time. He appears well-developed.  HENT:  Head: Normocephalic.  Eyes: Pupils are equal, round, and reactive to light.  Neck: Normal range of motion.  Cardiovascular: Normal rate.   Respiratory: Effort normal.  GI: Soft.  Genitourinary:  Genitourinary Comments: No CVAT.   Musculoskeletal: Normal range of motion.  Neurological: He is alert and oriented to person, place, and time.  Skin: Skin is warm.  Psychiatric: He has a normal mood and affect. His behavior is normal. Thought content normal.     Assessment/Plan Proceed as planned with prostate brachytherapy and absorbably rectal spacer placement. Risks, benefits, alternatives, expected peri-op course discussed previously and reiterated today.   Alexis Frock, MD 10/19/2016, 6:43 AM

## 2016-10-19 NOTE — Transfer of Care (Signed)
Immediate Anesthesia Transfer of Care Note  Patient: Cory [name]  Procedure(s) Performed: Procedure(s) (LRB): RADIOACTIVE SEED IMPLANT/BRACHYTHERAPY IMPLANT SPACE OR PLACEMENT (N/A) CYSTOSCOPY FLEXIBLE (N/A)  Patient Location: PACU  Anesthesia Type: General  Level of Consciousness: awake, oriented, sedated and patient cooperative  Airway & Oxygen Therapy: Patient Spontanous Breathing and Patient connected to face mask oxygen  Post-op Assessment: Report given to PACU RN and Post -op Vital signs reviewed and stable  Post vital signs: Reviewed and stable  Complications: No apparent anesthesia complications  Last Vitals:  Vitals:   10/19/16 1218 10/19/16 1616  BP: 128/69 (!) 141/92  Pulse: 67 84  Resp: 16 12  Temp: 36.6 C 36.5 C

## 2016-10-20 NOTE — Anesthesia Postprocedure Evaluation (Addendum)
Anesthesia Post Note  Patient: Cory [name]  Procedure(s) Performed: Procedure(s) (LRB): RADIOACTIVE SEED IMPLANT/BRACHYTHERAPY IMPLANT SPACEOAR PLACEMENT (N/A) CYSTOSCOPY FLEXIBLE (N/A)  Patient location during evaluation: PACU Anesthesia Type: General Level of consciousness: awake and alert Pain management: pain level controlled Vital Signs Assessment: post-procedure vital signs reviewed and stable Respiratory status: spontaneous breathing, nonlabored ventilation, respiratory function stable and patient connected to nasal cannula oxygen Cardiovascular status: blood pressure returned to baseline and stable Postop Assessment: no signs of nausea or vomiting Anesthetic complications: no       Last Vitals:  Vitals:   10/19/16 1645 10/19/16 1745  BP: 140/81 120/75  Pulse: 79 76  Resp: 16 14  Temp:  36.4 C    Last Pain:  Vitals:   10/19/16 1715  TempSrc:   PainSc: Cory [name]

## 2016-10-22 ENCOUNTER — Encounter (HOSPITAL_BASED_OUTPATIENT_CLINIC_OR_DEPARTMENT_OTHER): Payer: Self-pay | Admitting: Urology

## 2016-10-22 ENCOUNTER — Encounter (HOSPITAL_BASED_OUTPATIENT_CLINIC_OR_DEPARTMENT_OTHER): Payer: Self-pay | Admitting: Anesthesiology

## 2016-10-22 ENCOUNTER — Encounter: Payer: Self-pay | Admitting: Family Medicine

## 2016-10-22 NOTE — Op Note (Signed)
NAME:  Cory [name], Cory [name] NO.:  MEDICAL RECORD NO.:  [id number]  LOCATION:                                 FACILITY:  PHYSICIAN:  Alexis Frock, MD          DATE OF BIRTH:  DATE OF PROCEDURE: 10/19/2016                              OPERATIVE REPORT   DIAGNOSIS:  Moderate risk prostate cancer.  PROCEDURES: 1. Brachytherapy seed implantation. 2. Cystoscopy. 3. SpaceOAR gel implantation.  ESTIMATED BLOOD LOSS:  Nil.  COMPLICATIONS:  None.  SPECIMENS:  None.  FINDINGS: 1. Successful placement of 77 brachytherapy seeds as per plan,     delivering 145 Gy as per prescribed dose. 2. No evidence of brachytherapy seeds or spacers within the bladder or     urethra on final cystoscopy. 3. Successful placement of 10 mL of gel matrix perirectal spacer     posterior to the prostate, anterior to the rectum.  INDICATION:  Mr. Beckmann is a 56 year old gentleman who was found on workup of an elevated PSA to have moderate risk adenocarcinoma of the prostate by a physician in the [place] area.  The patient was subsequently relocated to [facility] and wished to have primary therapy here. Options were discussed for primary therapy including surgical extirpation versus ablative therapies and the patient adamantly wished to proceed with brachytherapy.  The patient was adequately counseled that given his young age, there will be some increased risk of secondary malignancies with any sort of radiotherapy and he understands this. Informed consent was obtained and placed in the medical record.  PROCEDURE IN DETAIL:  The patient being Cory [name] verified, procedure being brachytherapy seed implantation was confirmed.  Procedure was carried out.  Time-out was performed.  Intravenous antibiotics were administered.  General anesthesia was introduced.  The patient was placed into high lithotomy position.  Foley catheter was placed per urethra to straight drain with 10 mL of  contrast in the balloon.  This was prepped out of the field.  Scrotum was placed on the superior traction away from the perineum and the perineum was prepped using iodine.  Transrectal ultrasound probe was placed as were prostate anchors.  An operative radiation delivery planning was performed as per separate Radiation Oncology note.  Next, 25 individual needles containing 77 active seeds were then placed according to the prescribed plan.  Taking exquisite care to avoid prostate capsular violation which did not occur.  The anchors were removed as was the template.  Attention was directed to the [facility] placement.  Using freehand technique with a dual-planar ultrasound and transperineal approach, the SpaceOAR needle was introduced into position at the mid gland in a plane just anterior to the rectum within the anterior perirectal fat, as verified by dual-planar ultrasound.  3 mL of a slurry of 50% contrast and saline were used in a hydrodissection technique, was confirmed needle placement.  Next, 10 mL of the SpaceOAR containing 5 mL of product and accelerator were delivered as per manufacturer's instructions which resulted in excellent posterior displacement of the anterior rectal wall away from the posterior prostate.    Next, cystourethroscopy was performed using a 16-French  flexible cystoscope. Inspection of the anterior and posterior urethra unremarkable. Inspection of bladder revealed no diverticula, calcifications, papillary lesions.  Ureteral orifices in normal anatomic position.  There was no evidence of seed or spacer placement within the bladder or pendulous urethra.  The bladder was partially emptied per Foley catheter. Procedure was terminated.  The patient tolerated the procedure well. There were no immediate periprocedural complications.  The patient was taken to the postanesthesia care unit in stable condition.          ______________________________ Alexis Frock,  MD     TM/MEDQ  D:  10/19/2016  T:  10/19/2016  Job:  [id number]

## 2016-11-06 NOTE — Telephone Encounter (Signed)
Pt had colonoscopy with Dr. Havery Moros on 10-09-16

## 2016-11-14 ENCOUNTER — Telehealth: Payer: Self-pay | Admitting: *Deleted

## 2016-11-14 ENCOUNTER — Other Ambulatory Visit: Payer: Self-pay | Admitting: Radiation Oncology

## 2016-11-14 DIAGNOSIS — C61 Malignant neoplasm of prostate: Secondary | ICD-10-CM

## 2016-11-14 NOTE — Telephone Encounter (Signed)
CALLED PATIENT TO REMIND OF POST SEED APPTS. FOR 11-15-16, SPOKE WITH PATIENT AND HE IS AWARE OF THESE APPTS.

## 2016-11-15 ENCOUNTER — Ambulatory Visit
Admission: RE | Admit: 2016-11-15 | Discharge: 2016-11-15 | Disposition: A | Discharge status: Home / Self Care | Payer: 59 | Source: Ambulatory Visit | Attending: Radiation Oncology | Admitting: Radiation Oncology

## 2016-11-15 VITALS — BP 121/85 | HR 93 | Resp 18 | Wt 159.2 lb

## 2016-11-15 DIAGNOSIS — Z510 Encounter for antineoplastic radiation therapy: Secondary | ICD-10-CM | POA: Insufficient documentation

## 2016-11-15 DIAGNOSIS — C61 Malignant neoplasm of prostate: Secondary | ICD-10-CM

## 2016-11-15 NOTE — Progress Notes (Signed)
  Radiation Oncology         [phone] ________________________________  Name: Mahonri Seiden MRN: [id number]  Date: 11/15/2016  DOB: Aug 04, 1960  COMPLEX SIMULATION NOTE  NARRATIVE:  The patient was brought to the [facility] today following prostate seed implantation approximately one month ago.  Identity was confirmed.  All relevant records and images related to the planned course of therapy were reviewed.  Then, the patient was set-up supine.  CT images were obtained.  The CT images were loaded into the planning software.  Then the prostate and rectum were contoured.  Treatment planning then occurred.  The implanted iodine 125 seeds were identified by the physics staff for projection of radiation distribution  I have requested : 3D Simulation  I have requested a DVH of the following structures: Prostate and rectum.    ________________________________  Sheral Apley Tammi Klippel, M.D.  This document serves as a record of services personally performed by Tyler Pita, MD. It was created on his behalf by Bethann Humble, a trained medical scribe. The creation of this record is based on the scribe's personal observations and the provider's statements to them. This document has been checked and approved by the attending provider.

## 2016-11-15 NOTE — Progress Notes (Signed)
Radiation Oncology         [phone] ________________________________  Name: Cory [name] MRN: [id number]  Date: 11/15/2016  DOB: Oct 24, 1960  Follow-Up Visit Note  CC: Coral Spikes, DO  Alexis Frock, MD  Diagnosis:   56 y.o.gentleman with stage T2aadenocarcinoma of the prostate with a Gleason's score of 3+4and a PSA of 5.56     ICD-9-CM ICD-10-CM   1. Malignant neoplasm of prostate ([place]) 185 C61   2. Prostate cancer ([facility]) 185 C61     Interval Since Last Radiation:  1 month  10/19/16: radioactive seed implant with SpaceOAR  Narrative:  The patient returns today for routine follow-up.  He reports increased urinary frequency, urgency, weak stream and urinary hesitation. He does note that the weak stream and hseistancy are gradually improving over the past week.  He denies gross hematuria, dysuria, flank pain, abdominal pain or N/V.  He has occasional loose stools but overall is pleased with his bowel habits.  He filled out a questionnaire regarding urinary function today providing and overall IPSS score of 19 characterizing his symptoms as moderate.  His pre-implant score was 16.  He has mild fatigue which is not bothersome or interfering with his daily activities. He reports good appetite and is maintaining his weight.   ALLERGIES:  has No Known Allergies.  Meds: Current Outpatient Prescriptions  Medication Sig Dispense Refill  . famciclovir (FAMVIR) 500 MG tablet Take 1 tablet (500 mg total) by mouth 2 (two) times daily. For 7 days at first sign of cold sore. (Patient taking differently: Take 500 mg by mouth 2 (two) times daily. Prn) 90 tablet 0  . levothyroxine (SYNTHROID, LEVOTHROID) 100 MCG tablet TAKE 1 TABLET BY MOUTH DAILY 30 tablet 2  . Magnesium 500 MG CAPS Take by mouth at bedtime.    . mirtazapine (REMERON) 30 MG tablet TAKE 1 TABLET BY MOUTH EACH NIGHT AT BEDTIME 30 tablet 0  . senna-docusate (SENOKOT-S) 8.6-50 MG tablet Take 1 tablet by mouth 2 (two) times daily.  While taking strong pain meds to prevent constipation. 30 tablet 0  . tamsulosin (FLOMAX) 0.4 MG CAPS capsule Take 1 capsule (0.4 mg total) by mouth daily as needed. For urinary urgency / frequency. 30 capsule 11  . traMADol (ULTRAM) 50 MG tablet Take 1-2 tablets (50-100 mg total) by mouth every 6 (six) hours as needed for moderate pain or severe pain. Post-operatively 20 tablet 0   No current facility-administered medications for this encounter.     Physical Findings: In general this is a well appearing caucasian male in no acute distress. He's alert and oriented x4 and appropriate throughout the examination. Cardiopulmonary assessment is negative for acute distress and he exhibits normal effort.   weight is 159 lb 3.2 oz (72.2 kg). His blood pressure is 121/85 and his pulse is 93. His respiration is 18 and oxygen saturation is 100%. .  No significant changes.  Lab Findings: Lab Results  Component Value Date   WBC 5.7 10/12/2016   HGB 14.4 10/12/2016   HCT 41.9 10/12/2016   MCV 87.5 10/12/2016   PLT 250 10/12/2016    Radiographic Findings:  Patient underwent CT imaging in our clinic for post implant dosimetry. This was personally reviewed by Dr. Tammi Klippel. The CT appears to demonstrate an adequate distribution of radioactive seeds throughout the prostate gland. There no seeds in her near the rectum. I suspect the final radiation plan and dosimetry will show appropriate coverage of the prostate gland.   He  is scheduled for MRI prostate on Sat. 11/17/16 for verification of SpaceOAR placement.  Impression: The patient is recovering from the effects of radiation. His urinary symptoms should gradually improve over the next 4-6 months. We talked about this today. He is encouraged by his improvement already and is otherwise pleased with his outcome.   Plan: Today, Dr. Tammi Klippel spent time talking to the patient about his prostate seed implant and resolving urinary symptoms. We also talked about  long-term follow-up for prostate cancer following seed implant. He understands that ongoing PSA determinations and digital rectal exams will help perform surveillance to rule out disease recurrence. He understands what to expect with his PSA measures. He has a follow up appointment scheduled with Dr. Tresa Moore on 12/19/16. Patient was also educated today about some of the long-term effects from radiation including a small risk for rectal bleeding and possibly erectile dysfunction. We talked about some of the general management approaches to these potential complications. However, I did encourage the patient to contact our office or return at any point if he has questions or concerns related to his previous radiation and prostate cancer.   Nicholos Johns, PA-C  and  Sheral Apley Tammi Klippel, M.D.  This document serves as a record of services personally performed by Tyler Pita, MD and Freeman Caldron, PA-C. It was created on their behalf by Bethann Humble, a trained medical scribe. The creation of this record is based on the scribe's personal observations and the provider's statements to them. This document has been checked and approved by the attending provider.

## 2016-11-15 NOTE — Progress Notes (Signed)
Weight and vitals stable. Denies pain. Pre seed IPSS 16. Post seed IPSS 19. Scheduled to follow up with urologist in May. Denies hematuria, dysuria, or incontinence. Patient's biggest complaint is a weak urine stream. Denies any bowel complaints.   BP 121/85 (BP Location: Right Arm, Patient Position: Sitting, Cuff Size: Normal)   Pulse 93   Resp 18   Wt 159 lb 3.2 oz (72.2 kg)   SpO2 100%   BMI 24.93 kg/m  Wt Readings from Last 3 Encounters:  11/15/16 159 lb 3.2 oz (72.2 kg)  10/19/16 157 lb 8 oz (71.4 kg)  10/09/16 162 lb (73.5 kg)

## 2016-11-17 ENCOUNTER — Ambulatory Visit (HOSPITAL_COMMUNITY)
Admission: RE | Admit: 2016-11-17 | Discharge: 2016-11-17 | Disposition: A | Discharge status: Home / Self Care | Payer: 59 | Source: Ambulatory Visit | Attending: Radiation Oncology | Admitting: Radiation Oncology

## 2016-11-17 DIAGNOSIS — C61 Malignant neoplasm of prostate: Secondary | ICD-10-CM | POA: Insufficient documentation

## 2016-12-11 ENCOUNTER — Encounter: Payer: Self-pay | Admitting: Radiation Oncology

## 2016-12-11 DIAGNOSIS — C61 Malignant neoplasm of prostate: Secondary | ICD-10-CM | POA: Diagnosis not present

## 2016-12-11 DIAGNOSIS — Z510 Encounter for antineoplastic radiation therapy: Secondary | ICD-10-CM | POA: Diagnosis present

## 2016-12-12 NOTE — Progress Notes (Signed)
  Radiation Oncology         [phone] ________________________________  Name: Cory [name] MRN: [id number]  Date: 12/11/2016  DOB: March 03, 1961  3D Planning Note   Prostate Brachytherapy Post-Implant Dosimetry  Diagnosis: 56 y.o.gentleman with stage T2aadenocarcinoma of the prostate with a Gleason's score of 3+4and a PSA of 5.56  Narrative: On a previous date, Cory [name] returned following prostate seed implantation for post implant planning. He underwent CT scan complex simulation to delineate the three-dimensional structures of the pelvis and demonstrate the radiation distribution.  Since that time, the seed localization, and complex isodose planning with dose volume histograms have now been completed.  Results:   Prostate Coverage - The dose of radiation delivered to the 90% or more of the prostate gland (D90) was 98.11% of the prescription dose. This exceeds our goal of greater than 90%. Rectal Sparing - The volume of rectal tissue receiving the prescription dose or higher was 0.0 cc. This falls under our thresholds tolerance of 1.0 cc.  Impression: The prostate seed implant appears to show adequate target coverage and appropriate rectal sparing.  Plan:  The patient will continue to follow with urology for ongoing PSA determinations. I would anticipate a high likelihood for local tumor control with minimal risk for rectal morbidity.  ________________________________  Sheral Apley Tammi Klippel, M.D.

## 2016-12-20 ENCOUNTER — Ambulatory Visit (INDEPENDENT_AMBULATORY_CARE_PROVIDER_SITE_OTHER): Payer: 59 | Admitting: Family Medicine

## 2016-12-20 ENCOUNTER — Other Ambulatory Visit: Payer: Self-pay | Admitting: Family Medicine

## 2016-12-20 ENCOUNTER — Encounter: Payer: Self-pay | Admitting: Family Medicine

## 2016-12-20 VITALS — BP 118/76 | HR 68 | Temp 98.1°F | Resp 16 | Ht 68.0 in | Wt 159.0 lb

## 2016-12-20 DIAGNOSIS — Z0000 Encounter for general adult medical examination without abnormal findings: Secondary | ICD-10-CM | POA: Diagnosis not present

## 2016-12-20 DIAGNOSIS — Z23 Encounter for immunization: Secondary | ICD-10-CM | POA: Diagnosis not present

## 2016-12-20 DIAGNOSIS — C61 Malignant neoplasm of prostate: Secondary | ICD-10-CM | POA: Diagnosis not present

## 2016-12-20 DIAGNOSIS — E7800 Pure hypercholesterolemia, unspecified: Secondary | ICD-10-CM | POA: Diagnosis not present

## 2016-12-20 DIAGNOSIS — Z130 Encounter for screening for diseases of the blood and blood-forming organs and certain disorders involving the immune mechanism: Secondary | ICD-10-CM | POA: Diagnosis not present

## 2016-12-20 DIAGNOSIS — E039 Hypothyroidism, unspecified: Secondary | ICD-10-CM | POA: Diagnosis not present

## 2016-12-20 DIAGNOSIS — Z0001 Encounter for general adult medical examination with abnormal findings: Secondary | ICD-10-CM | POA: Insufficient documentation

## 2016-12-20 DIAGNOSIS — E785 Hyperlipidemia, unspecified: Secondary | ICD-10-CM | POA: Insufficient documentation

## 2016-12-20 LAB — CBC
HEMATOCRIT: 44.4 % (ref 39.0–52.0)
HEMOGLOBIN: 15 g/dL (ref 13.0–17.0)
MCHC: 33.8 g/dL (ref 30.0–36.0)
MCV: 90.5 fl (ref 78.0–100.0)
PLATELETS: 273 10*3/uL (ref 150.0–400.0)
RBC: 4.91 Mil/uL (ref 4.22–5.81)
RDW: 13.6 % (ref 11.5–15.5)
WBC: 6 10*3/uL (ref 4.0–10.5)

## 2016-12-20 LAB — COMPREHENSIVE METABOLIC PANEL
ALBUMIN: 4.7 g/dL (ref 3.5–5.2)
ALK PHOS: 89 U/L (ref 39–117)
ALT: 11 U/L (ref 0–53)
AST: 15 U/L (ref 0–37)
BILIRUBIN TOTAL: 1 mg/dL (ref 0.2–1.2)
BUN: 18 mg/dL (ref 6–23)
CALCIUM: 9.9 mg/dL (ref 8.4–10.5)
CO2: 30 mEq/L (ref 19–32)
Chloride: 102 mEq/L (ref 96–112)
Creatinine, Ser: 1.21 mg/dL (ref 0.40–1.50)
GFR: 66 mL/min (ref >60.00)
GLUCOSE: 98 mg/dL (ref 70–99)
Potassium: 4.9 mEq/L (ref 3.5–5.1)
Sodium: 137 mEq/L (ref 135–145)
TOTAL PROTEIN: 7.3 g/dL (ref 6.0–8.3)

## 2016-12-20 LAB — LIPID PANEL
CHOLESTEROL: 253 mg/dL — AB (ref 0–200)
HDL: 44.6 mg/dL (ref >39.00)
LDL Cholesterol: 178 mg/dL — ABNORMAL HIGH (ref 0–99)
NONHDL: 208.76
Total CHOL/HDL Ratio: 6
Triglycerides: 156 mg/dL — ABNORMAL HIGH (ref 0.0–149.0)
VLDL: 31.2 mg/dL (ref 0.0–40.0)

## 2016-12-20 LAB — TSH: TSH: 5.29 u[IU]/mL — ABNORMAL HIGH (ref 0.35–4.50)

## 2016-12-20 MED ORDER — LEVOTHYROXINE SODIUM 112 MCG PO TABS: 112.0000 ug | ORAL_TABLET | Freq: Every day | ORAL | 1 refills | Status: DC

## 2016-12-20 NOTE — Assessment & Plan Note (Signed)
Labs including Hep C screening today. Tdap today. Shingrix today. Colonoscopy up to date.  Declines HIV screening.

## 2016-12-20 NOTE — Progress Notes (Signed)
Subjective:  Patient ID: Cory [name], male    DOB: Mar 17, 1961  Age: 56 y.o. MRN: [id number]  CC: Annual physical exam.  HPI Cory [name] is a 56 y.o. male presents to the clinic today for an annual physical exam.  Preventative Healthcare  Colonoscopy: Up to date.  Immunizations  Tetanus - In need of .  Zoster - Would like shingles vaccine.  Hepatitis C screening - Desires screening.  Labs: Labs today.  Alcohol use: See below.  Smoking/tobacco use: Former.  STD/HIV testing: Declines.  PMH, Surgical Hx, Family Hx, Social History reviewed and updated as below.  Past Medical History:  Diagnosis Date  . Benign localized prostatic hyperplasia with lower urinary tract symptoms (LUTS)   . Diverticulosis of colon   . History of adenomatous polyp of colon    2013  . History of pneumothorax    1989--  s/p right thoractomy for bleb  . Hypothyroidism   . Insomnia   . Prostate cancer [facility]) dx 02-26-2016 via bx--  urologist-  dr Tresa Moore  oncologist-  dr Tammi Klippel   Stage T2a, Gleason 7,  PSA 5.56,  vol 44cc   Past Surgical History:  Procedure Laterality Date  . COLONOSCOPY  last one 10-09-2016  . CYSTOSCOPY N/A 10/19/2016   Procedure: CYSTOSCOPY FLEXIBLE;  Surgeon: Alexis Frock, MD;  Location: [facility];  Service: Urology;  Laterality: N/A;  no seeds found in bladder  . PROSTATE BIOPSY  01/2016  . RADIOACTIVE SEED IMPLANT N/A 10/19/2016   Procedure: RADIOACTIVE SEED IMPLANT/BRACHYTHERAPY IMPLANT SPACEOAR PLACEMENT;  Surgeon: Alexis Frock, MD;  Location: [facility];  Service: Urology;  Laterality: N/A;   seeds implanted--77   . THORACOTOMY Right 1989   Bleb / pneumothorax    Family History  Problem Relation Age of Onset  . Lung cancer Mother   . Hyperlipidemia Mother   . Hypertension Mother   . Cancer Sister        thyroid/evironmental   Social History  Substance Use Topics  . Smoking status: Former Smoker    Packs/day: 1.00    Years:  35.00    Types: Cigarettes    Quit date: 07/30/2013  . Smokeless tobacco: Never Used  . Alcohol use 0.6 oz/week    1 Cans of beer per week    Review of Systems  Genitourinary: Positive for difficulty urinating and frequency.  Neurological: Positive for dizziness.  All other systems reviewed and are negative.  Objective:   Today's Vitals: BP 118/76 (BP Location: Left Arm, Patient Position: Sitting, Cuff Size: Normal)   Pulse 68   Temp 98.1 F (36.7 C) (Oral)   Resp 16   Ht 5\' 8"  (1.727 m)   Wt 159 lb (72.1 kg)   SpO2 98%   BMI 24.18 kg/m   Physical Exam  Constitutional: He is oriented to person, place, and time. He appears well-developed and well-nourished. No distress.  HENT:  Head: Normocephalic and atraumatic.  Nose: Nose normal.  Mouth/Throat: Oropharynx is clear and moist. No oropharyngeal exudate.  Normal TM's bilaterally.  Poor dentition.  Eyes: Conjunctivae are normal. No scleral icterus.  Neck: Neck supple.  Cardiovascular: Normal rate and regular rhythm.   No murmur heard. Pulmonary/Chest: Effort normal and breath sounds normal. He has no wheezes. He has no rales.  Abdominal: Soft. He exhibits no distension. There is no tenderness. There is no rebound and no guarding.  Musculoskeletal: Normal range of motion. He exhibits no edema.  Lymphadenopathy:  He has no cervical adenopathy.  Neurological: He is alert and oriented to person, place, and time.  Skin: Skin is warm and dry. No rash noted.  Psychiatric: He has a normal mood and affect.  Vitals reviewed.  Assessment & Plan:   Problem List Items Addressed This Visit    Prostate cancer [facility])   Relevant Orders   Comprehensive metabolic panel   Hypothyroidism   Relevant Orders   TSH   Hyperlipidemia   Relevant Orders   Lipid panel   Annual physical exam - Primary    Labs including Hep C screening today. Tdap today. Shingrix today. Colonoscopy up to date.  Declines HIV screening.       Other  Visit Diagnoses    Screening for deficiency anemia       Relevant Orders   CBC   Need for diphtheria-tetanus-pertussis (Tdap) vaccine       Relevant Orders   Tdap vaccine greater than or equal to 7yo IM   Need for shingles vaccine       Relevant Orders   Varicella-zoster vaccine IM (Shingrix)     Follow-up: Annually  [place]

## 2016-12-20 NOTE — Patient Instructions (Addendum)
Follow up annually.  Take care  Dr. Lacinda Axon     Health Maintenance, Male A healthy lifestyle and preventive care is important for your health and wellness. Ask your health care provider about what schedule of regular examinations is right for you. What should I know about weight and diet?  Eat a Healthy Diet  Eat plenty of vegetables, fruits, whole grains, low-fat dairy products, and lean protein.  Do not eat a lot of foods high in solid fats, added sugars, or salt. Maintain a Healthy Weight  Regular exercise can help you achieve or maintain a healthy weight. You should:  Do at least 150 minutes of exercise each week. The exercise should increase your heart rate and make you sweat (moderate-intensity exercise).  Do strength-training exercises at least twice a week. Watch Your Levels of Cholesterol and Blood Lipids  Have your blood tested for lipids and cholesterol every 5 years starting at 56 years of age. If you are at high risk for heart disease, you should start having your blood tested when you are 56 years old. You may need to have your cholesterol levels checked more often if:  Your lipid or cholesterol levels are high.  You are older than 56 years of age.  You are at high risk for heart disease. What should I know about cancer screening? Many types of cancers can be detected early and may often be prevented. Lung Cancer  You should be screened every year for lung cancer if:  You are a current smoker who has smoked for at least 30 years.  You are a former smoker who has quit within the past 15 years.  Talk to your health care provider about your screening options, when you should start screening, and how often you should be screened. Colorectal Cancer  Routine colorectal cancer screening usually begins at 56 years of age and should be repeated every 5-10 years until you are 56 years old. You may need to be screened more often if early forms of precancerous polyps or  small growths are found. Your health care provider may recommend screening at an earlier age if you have risk factors for colon cancer.  Your health care provider may recommend using home test kits to check for hidden blood in the stool.  A small camera at the end of a tube can be used to examine your colon (sigmoidoscopy or colonoscopy). This checks for the earliest forms of colorectal cancer. Prostate and Testicular Cancer  Depending on your age and overall health, your health care provider may do certain tests to screen for prostate and testicular cancer.  Talk to your health care provider about any symptoms or concerns you have about testicular or prostate cancer. Skin Cancer  Check your skin from head to toe regularly.  Tell your health care provider about any new moles or changes in moles, especially if:  There is a change in a mole's size, shape, or color.  You have a mole that is larger than a pencil eraser.  Always use sunscreen. Apply sunscreen liberally and repeat throughout the day.  Protect yourself by wearing long sleeves, pants, a wide-brimmed hat, and sunglasses when outside. What should I know about heart disease, diabetes, and high blood pressure?  If you are 36-19 years of age, have your blood pressure checked every 3-5 years. If you are 50 years of age or older, have your blood pressure checked every year. You should have your blood pressure measured twice-once when you are  at a hospital or clinic, and once when you are not at a hospital or clinic. Record the average of the two measurements. To check your blood pressure when you are not at a hospital or clinic, you can use:  An automated blood pressure machine at a pharmacy.  A home blood pressure monitor.  Talk to your health care provider about your target blood pressure.  If you are between 64-62 years old, ask your health care provider if you should take aspirin to prevent heart disease.  Have regular  diabetes screenings by checking your fasting blood sugar level.  If you are at a normal weight and have a low risk for diabetes, have this test once every three years after the age of 56.  If you are overweight and have a high risk for diabetes, consider being tested at a younger age or more often.  A one-time screening for abdominal aortic aneurysm (AAA) by ultrasound is recommended for men aged 62-75 years who are current or former smokers. What should I know about preventing infection? Hepatitis B  If you have a higher risk for hepatitis B, you should be screened for this virus. Talk with your health care provider to find out if you are at risk for hepatitis B infection. Hepatitis C  Blood testing is recommended for:  Everyone born from [birth year] through 1965.  Anyone with known risk factors for hepatitis C. Sexually Transmitted Diseases (STDs)  You should be screened each year for STDs including gonorrhea and chlamydia if:  You are sexually active and are younger than 56 years of age.  You are older than 56 years of age and your health care provider tells you that you are at risk for this type of infection.  Your sexual activity has changed since you were last screened and you are at an increased risk for chlamydia or gonorrhea. Ask your health care provider if you are at risk.  Talk with your health care provider about whether you are at high risk of being infected with HIV. Your health care provider may recommend a prescription medicine to help prevent HIV infection. What else can I do?  Schedule regular health, dental, and eye exams.  Stay current with your vaccines (immunizations).  Do not use any tobacco products, such as cigarettes, chewing tobacco, and e-cigarettes. If you need help quitting, ask your health care provider.  Limit alcohol intake to no more than 2 drinks per day. One drink equals 12 ounces of beer, 5 ounces of wine, or 1 ounces of hard liquor.  Do not use  street drugs.  Do not share needles.  Ask your health care provider for help if you need support or information about quitting drugs.  Tell your health care provider if you often feel depressed.  Tell your health care provider if you have ever been abused or do not feel safe at home. This information is not intended to replace advice given to you by your health care provider. Make sure you discuss any questions you have with your health care provider. Document Released: 01/12/2008 Document Revised: 03/14/2016 Document Reviewed: 04/19/2015 Elsevier Interactive Patient Education  2017 [employer].

## 2016-12-21 ENCOUNTER — Telehealth: Payer: Self-pay | Admitting: Family Medicine

## 2016-12-21 NOTE — Telephone Encounter (Signed)
Pt called back returning your call. Please advise, thank you!  Call pt @ [phone]

## 2016-12-21 NOTE — Telephone Encounter (Signed)
Patient advised of lab results see labs for documentation 

## 2016-12-26 ENCOUNTER — Other Ambulatory Visit: Payer: Self-pay

## 2016-12-26 MED ORDER — MIRTAZAPINE 30 MG PO TABS: ORAL_TABLET | ORAL | 1 refills | Status: DC

## 2016-12-28 NOTE — Addendum Note (Signed)
Addendum  created 12/28/16 1124 by Effie Berkshire, MD   Sign clinical note

## 2017-02-12 ENCOUNTER — Other Ambulatory Visit: Payer: Self-pay | Admitting: Family Medicine

## 2017-02-20 ENCOUNTER — Ambulatory Visit (INDEPENDENT_AMBULATORY_CARE_PROVIDER_SITE_OTHER): Payer: 59 | Admitting: *Deleted

## 2017-02-20 DIAGNOSIS — Z23 Encounter for immunization: Secondary | ICD-10-CM

## 2017-02-20 NOTE — Progress Notes (Signed)
Patient presented for last of shingrix vaccine series

## 2017-03-18 ENCOUNTER — Other Ambulatory Visit: Payer: Self-pay

## 2017-03-18 MED ORDER — MIRTAZAPINE 30 MG PO TABS: ORAL_TABLET | ORAL | 1 refills | Status: DC

## 2017-04-08 ENCOUNTER — Other Ambulatory Visit: Payer: Self-pay | Admitting: Family Medicine

## 2017-04-15 ENCOUNTER — Other Ambulatory Visit: Payer: Self-pay | Admitting: Family Medicine

## 2017-04-15 MED ORDER — LEVOTHYROXINE SODIUM 112 MCG PO TABS: 112.0000 ug | ORAL_TABLET | Freq: Every day | ORAL | 1 refills | Status: DC

## 2017-04-15 NOTE — Telephone Encounter (Signed)
Patient has only seen Dr.Cook please advise

## 2017-04-15 NOTE — Telephone Encounter (Signed)
Pt called and stated that he is out of town and forgot his medication. He would like mirtazapine (REMERON) 30 MG tablet, and levothyroxine (SYNTHROID, LEVOTHROID) 112 MCG tablet called in for 1 week. Please advise, thank you!  Pharmacy - [facility], [place], [place] phone [phone]

## 2017-04-16 MED ORDER — MIRTAZAPINE 30 MG PO TABS: ORAL_TABLET | ORAL | 1 refills | Status: DC

## 2017-04-16 NOTE — Telephone Encounter (Signed)
Script faxed.

## 2017-07-12 ENCOUNTER — Other Ambulatory Visit: Payer: Self-pay | Admitting: Family Medicine

## 2017-07-12 NOTE — Telephone Encounter (Signed)
  Last o/v: 02/20/17 Next office visit 12/24/17

## 2017-09-18 ENCOUNTER — Other Ambulatory Visit: Payer: Self-pay | Admitting: Family Medicine

## 2017-10-14 ENCOUNTER — Other Ambulatory Visit: Payer: Self-pay | Admitting: Family Medicine

## 2017-10-24 ENCOUNTER — Ambulatory Visit: Payer: 59 | Admitting: Internal Medicine

## 2017-11-18 ENCOUNTER — Other Ambulatory Visit: Payer: Self-pay | Admitting: Family Medicine

## 2017-11-18 NOTE — Telephone Encounter (Signed)
Last office visit  12/20/16 Next office visit 12/24/17 Last TSH 12/20/16

## 2017-11-18 NOTE — Telephone Encounter (Signed)
Synthroid refilled.  Patient needs to keep his appointment to get further refills.  Thanks.

## 2017-11-21 NOTE — Telephone Encounter (Signed)
Made a note on appt schedule

## 2017-11-24 ENCOUNTER — Other Ambulatory Visit: Payer: Self-pay | Admitting: Family Medicine

## 2017-12-16 ENCOUNTER — Other Ambulatory Visit: Payer: Self-pay | Admitting: Family Medicine

## 2017-12-24 ENCOUNTER — Encounter: Payer: Self-pay | Admitting: Family Medicine

## 2017-12-24 ENCOUNTER — Encounter: Payer: 59 | Admitting: Family Medicine

## 2017-12-24 ENCOUNTER — Ambulatory Visit (INDEPENDENT_AMBULATORY_CARE_PROVIDER_SITE_OTHER): Payer: 59 | Admitting: Family Medicine

## 2017-12-24 ENCOUNTER — Other Ambulatory Visit: Payer: Self-pay

## 2017-12-24 VITALS — BP 114/72 | HR 70 | Temp 97.8°F | Wt 162.0 lb

## 2017-12-24 DIAGNOSIS — Z0001 Encounter for general adult medical examination with abnormal findings: Secondary | ICD-10-CM | POA: Diagnosis not present

## 2017-12-24 DIAGNOSIS — E039 Hypothyroidism, unspecified: Secondary | ICD-10-CM | POA: Diagnosis not present

## 2017-12-24 DIAGNOSIS — E7800 Pure hypercholesterolemia, unspecified: Secondary | ICD-10-CM | POA: Diagnosis not present

## 2017-12-24 LAB — COMPREHENSIVE METABOLIC PANEL
ALBUMIN: 4 g/dL (ref 3.5–5.2)
ALK PHOS: 89 U/L (ref 39–117)
ALT: 12 U/L (ref 0–53)
AST: 13 U/L (ref 0–37)
BUN: 15 mg/dL (ref 6–23)
CO2: 25 mEq/L (ref 19–32)
CREATININE: 0.93 mg/dL (ref 0.40–1.50)
Calcium: 9 mg/dL (ref 8.4–10.5)
Chloride: 104 mEq/L (ref 96–112)
GFR: 89.1 mL/min (ref >60.00)
Glucose, Bld: 98 mg/dL (ref 70–99)
Potassium: 4.1 mEq/L (ref 3.5–5.1)
SODIUM: 137 meq/L (ref 135–145)
TOTAL PROTEIN: 6.8 g/dL (ref 6.0–8.3)
Total Bilirubin: 0.9 mg/dL (ref 0.2–1.2)

## 2017-12-24 LAB — LIPID PANEL
CHOLESTEROL: 207 mg/dL — AB (ref 0–200)
HDL: 37.2 mg/dL — ABNORMAL LOW (ref >39.00)
NonHDL: 169.74
Total CHOL/HDL Ratio: 6
Triglycerides: 206 mg/dL — ABNORMAL HIGH (ref 0.0–149.0)
VLDL: 41.2 mg/dL — ABNORMAL HIGH (ref 0.0–40.0)

## 2017-12-24 LAB — LDL CHOLESTEROL, DIRECT: LDL DIRECT: 146 mg/dL

## 2017-12-24 LAB — TSH: TSH: 0.55 u[IU]/mL (ref 0.35–4.50)

## 2017-12-24 NOTE — Assessment & Plan Note (Signed)
Physical exam completed.  Encouraged good diet and adding and exercise.  Discussed elevated cholesterol on last check and that he may need to go on medication if still elevated today.  He will see urology as planned in the office and for labs.  I discussed lung cancer screening given his smoking history and he noted he would think about this.  Encouraged to see an eye doctor if his use of readers worsens.  Lab work as outlined below.

## 2017-12-24 NOTE — Progress Notes (Signed)
Tommi Rumps, MD Phone: [phone]  Cory [name] is a 57 y.o. male who presents today for CPE.  Exercises by staying active at work.  Gets 4 to 5 miles of walking daily at work. Diet is typical American with only occasional fried fatty foods.  No soda or sweet tea.  Not a lot of sweets.  Plenty fruits and vegetables. Colonoscopy 10/09/2016 with adenomatous polyp.  5-year recall. PSA is followed through urology.  He was treated with brachitherapy last year for prostate cancer.  He notes once a week having bright red blood with urination with a little burning.  The rest of the week he is fine.  He sees urology today for labs and urinalysis and then follows up with them next week. Tetanus vaccination up-to-date. Shingrix up-to-date. Hepatitis C testing up-to-date. Patient quit smoking 4 years ago.  He smoked 1 pack/day for about 30 years.  He will think about lung cancer screening. Has 6 beers a week.  No illicit drug use. He does not see an ophthalmologist.  He does use readers.  He does see a dentist.  Active Ambulatory Problems    Diagnosis Date Noted  . Prostate cancer ([place]) 07/12/2016  . Hypothyroidism 07/12/2016  . Insomnia 07/12/2016  . Hyperlipidemia 12/20/2016  . Encounter for general adult medical examination with abnormal findings 12/20/2016   Resolved Ambulatory Problems    Diagnosis Date Noted  . Colon cancer screening 07/12/2016   Past Medical History:  Diagnosis Date  . Benign localized prostatic hyperplasia with lower urinary tract symptoms (LUTS)   . Diverticulosis of colon   . History of adenomatous polyp of colon   . History of pneumothorax   . Hypothyroidism   . Insomnia   . Prostate cancer [facility]) dx 02-26-2016 via bx--  urologist-  dr Tresa Moore  oncologist-  dr Tammi Klippel    Family History  Problem Relation Age of Onset  . Lung cancer Mother   . Hyperlipidemia Mother   . Hypertension Mother   . Cancer Sister        thyroid/evironmental    Social History    Socioeconomic History  . Marital status: Married    Spouse name: Not on file  . Number of children: Not on file  . Years of education: Not on file  . Highest education level: Not on file  Occupational History  . Not on file  Social Needs  . Financial resource strain: Not on file  . Food insecurity:    Worry: Not on file    Inability: Not on file  . Transportation needs:    Medical: Not on file    Non-medical: Not on file  Tobacco Use  . Smoking status: Former Smoker    Packs/day: 1.00    Years: 35.00    Pack years: 35.00    Types: Cigarettes    Last attempt to quit: 07/30/2013    Years since quitting: 4.4  . Smokeless tobacco: Never Used  Substance and Sexual Activity  . Alcohol use: Yes    Alcohol/week: 0.6 oz    Types: 1 Cans of beer per week  . Drug use: No  . Sexual activity: Not on file  Lifestyle  . Physical activity:    Days per week: Not on file    Minutes per session: Not on file  . Stress: Not on file  Relationships  . Social connections:    Talks on phone: Not on file    Gets together: Not on file  Attends religious service: Not on file    Active member of club or organization: Not on file    Attends meetings of clubs or organizations: Not on file    Relationship status: Not on file  . Intimate partner violence:    Fear of current or ex partner: Not on file    Emotionally abused: Not on file    Physically abused: Not on file    Forced sexual activity: Not on file  Other Topics Concern  . Not on file  Social History Narrative  . Not on file    ROS  General:  Negative for nexplained weight loss, fever Skin: Negative for new or changing mole, sore that won't heal HEENT: Negative for trouble hearing, trouble seeing, ringing in ears, mouth sores, hoarseness, change in voice, dysphagia. CV:  Negative for chest pain, dyspnea, edema, palpitations Resp: Negative for cough, dyspnea, hemoptysis GI: Negative for nausea, vomiting, diarrhea,  constipation, abdominal pain, melena, hematochezia. GU: Positive for hematuria, negative for dysuria, incontinence, urinary hesitance, vaginal or penile discharge, polyuria, sexual difficulty, lumps in testicle or breasts MSK: Negative for muscle cramps or aches, joint pain or swelling Neuro: Negative for headaches, weakness, numbness, dizziness, passing out/fainting Psych: Negative for depression, anxiety, memory problems  Objective  Physical Exam Vitals:   12/24/17 0914  BP: 114/72  Pulse: 70  Temp: 97.8 F (36.6 C)  SpO2: 97%    BP Readings from Last 3 Encounters:  12/24/17 114/72  12/20/16 118/76  11/15/16 121/85   Wt Readings from Last 3 Encounters:  12/24/17 162 lb (73.5 kg)  12/20/16 159 lb (72.1 kg)  11/15/16 159 lb 3.2 oz (72.2 kg)    Physical Exam  Constitutional: No distress.  HENT:  Head: Normocephalic and atraumatic.  Mouth/Throat: Oropharynx is clear and moist.  Eyes: Pupils are equal, round, and reactive to light. Conjunctivae are normal.  Cardiovascular: Normal rate, regular rhythm and normal heart sounds.  Pulmonary/Chest: Effort normal and breath sounds normal.  Abdominal: Soft. Bowel sounds are normal. He exhibits no distension. There is no tenderness.  Musculoskeletal: He exhibits no edema.  Neurological: He is alert.  Skin: Skin is warm and dry. He is not diaphoretic.  Psychiatric: He has a normal mood and affect.     Assessment/Plan:   Encounter for general adult medical examination with abnormal findings Physical exam completed.  Encouraged good diet and adding and exercise.  Discussed elevated cholesterol on last check and that he may need to go on medication if still elevated today.  He will see urology as planned in the office and for labs.  I discussed lung cancer screening given his smoking history and he noted he would think about this.  Encouraged to see an eye doctor if his use of readers worsens.  Lab work as outlined below.   Orders  Placed This Encounter  Procedures  . Comp Met (CMET)  . Lipid panel  . TSH    No orders of the defined types were placed in this encounter.    Tommi Rumps, MD [place]

## 2017-12-24 NOTE — Patient Instructions (Signed)
Nice to meet you.  Please see your urologist as planned. Please try to eat healthy and stay active. We will check lab work today and contact you with the results.

## 2018-01-16 ENCOUNTER — Other Ambulatory Visit: Payer: Self-pay | Admitting: Family Medicine

## 2018-01-24 ENCOUNTER — Other Ambulatory Visit: Payer: Self-pay | Admitting: Family Medicine

## 2018-01-27 ENCOUNTER — Other Ambulatory Visit: Payer: Self-pay | Admitting: Family Medicine

## 2018-01-29 NOTE — Telephone Encounter (Signed)
Please confirm use of this medication with the patient as well as dosing.  Please confirm the reason he takes this medication.

## 2018-01-29 NOTE — Telephone Encounter (Signed)
Last OV 12/24/17 last filled by Dr.Cook 09/21/16 90 0rf

## 2018-02-04 NOTE — Telephone Encounter (Signed)
Patient states he takes this as needed for cold sores. He takes 2 tablets daily for 4-5 days as needed. Patient states he takes this maybe twice a year

## 2018-03-12 ENCOUNTER — Other Ambulatory Visit: Payer: Self-pay | Admitting: Family Medicine

## 2018-03-12 NOTE — Telephone Encounter (Signed)
Last OV 12/24/17 last filled by Dr.Cook 04/16/17 90 1rf

## 2018-03-16 ENCOUNTER — Other Ambulatory Visit: Payer: Self-pay | Admitting: Family Medicine

## 2018-05-08 ENCOUNTER — Other Ambulatory Visit: Payer: Self-pay | Admitting: Family Medicine

## 2018-06-17 ENCOUNTER — Encounter

## 2018-06-17 ENCOUNTER — Ambulatory Visit: Payer: 59 | Admitting: Family Medicine

## 2018-06-17 ENCOUNTER — Encounter: Payer: Self-pay | Admitting: Family Medicine

## 2018-06-17 VITALS — BP 118/70 | HR 71 | Temp 98.2°F | Ht 68.0 in | Wt 158.1 lb

## 2018-06-17 DIAGNOSIS — E785 Hyperlipidemia, unspecified: Secondary | ICD-10-CM | POA: Diagnosis not present

## 2018-06-17 DIAGNOSIS — F5101 Primary insomnia: Secondary | ICD-10-CM | POA: Diagnosis not present

## 2018-06-17 DIAGNOSIS — Z87891 Personal history of nicotine dependence: Secondary | ICD-10-CM | POA: Insufficient documentation

## 2018-06-17 LAB — LDL CHOLESTEROL, DIRECT: Direct LDL: 187 mg/dL

## 2018-06-17 NOTE — Assessment & Plan Note (Signed)
Check LDL. 

## 2018-06-17 NOTE — Progress Notes (Addendum)
  Tommi Rumps, MD Phone: [phone]  Cory [name] is a 57 y.o. male who presents today for f/u.  History of tobacco abuse: Patient reports he quit smoking 4 years ago.  He reports a history of pneumothoraces bilaterally on separate occasions.  He had surgery on his right lung for this previously.  He notes no shortness of breath, cough, or wheezing.  He notes he smoked 1 pack/day for 35 years.  No family history of lung cancer.  Hyperlipidemia: Previously elevated.  He has not been exercising though does get 3 miles of walking per day at work.  He is also active at work.  He has not worked on dietary changes though he does not eat much junk food.  Insomnia: Patient is taking Remeron.  This works well.  Get 6 to 8 hours of sleep nightly.  He does wake up well rested.  No drowsiness with the medication.  No anxiety or depression.  Social History   Tobacco Use  Smoking Status Former Smoker  . Packs/day: 1.00  . Years: 35.00  . Pack years: 35.00  . Types: Cigarettes  . Last attempt to quit: 07/30/2013  . Years since quitting: 4.8  Smokeless Tobacco Never Used     ROS see history of present illness  Objective  Physical Exam Vitals:   06/17/18 1016  BP: 118/70  Pulse: 71  Temp: 98.2 F (36.8 C)  SpO2: 96%    BP Readings from Last 3 Encounters:  06/17/18 118/70  12/24/17 114/72  12/20/16 118/76   Wt Readings from Last 3 Encounters:  06/17/18 158 lb 1.9 oz (71.7 kg)  12/24/17 162 lb (73.5 kg)  12/20/16 159 lb (72.1 kg)    Physical Exam  Constitutional: No distress.  Cardiovascular: Normal rate, regular rhythm and normal heart sounds.  Pulmonary/Chest: Effort normal and breath sounds normal.  Neurological: He is alert.  Skin: Skin is warm and dry. He is not diaphoretic.     Assessment/Plan: Please see individual problem list.  History of tobacco abuse Currently asymptomatic.  We will send a message to Burgess Estelle at the cancer center to get the patient set up  for lung cancer screening.  Hyperlipidemia Check LDL.  Insomnia Stable on Remeron.  He will continue this medication.   Orders Placed This Encounter  Procedures  . LDL cholesterol, direct    No orders of the defined types were placed in this encounter.    Tommi Rumps, MD [place]

## 2018-06-17 NOTE — Assessment & Plan Note (Signed)
Stable on Remeron.  He will continue this medication.

## 2018-06-17 NOTE — Assessment & Plan Note (Signed)
Currently asymptomatic.  We will send a message to Burgess Estelle at the cancer center to get the patient set up for lung cancer screening.

## 2018-06-18 ENCOUNTER — Telehealth: Payer: Self-pay | Admitting: *Deleted

## 2018-06-18 DIAGNOSIS — Z87891 Personal history of nicotine dependence: Secondary | ICD-10-CM

## 2018-06-18 DIAGNOSIS — Z122 Encounter for screening for malignant neoplasm of respiratory organs: Secondary | ICD-10-CM

## 2018-06-18 NOTE — Telephone Encounter (Signed)
Received referral for initial lung cancer screening scan. Contacted patient and obtained smoking history,(former, quit 2014, 38 pack year) as well as answering questions related to screening process. Patient denies signs of lung cancer such as weight loss or hemoptysis. Patient denies comorbidity that would prevent curative treatment if lung cancer were found. Patient is scheduled for shared decision making visit and CT scan on 07/07/18 at 915am.

## 2018-06-19 ENCOUNTER — Other Ambulatory Visit: Payer: Self-pay | Admitting: Family Medicine

## 2018-06-19 DIAGNOSIS — E785 Hyperlipidemia, unspecified: Secondary | ICD-10-CM

## 2018-06-19 MED ORDER — ROSUVASTATIN CALCIUM 20 MG PO TABS: 20.0000 mg | ORAL_TABLET | Freq: Every day | ORAL | 3 refills | Status: DC

## 2018-07-07 ENCOUNTER — Inpatient Hospital Stay: Payer: 59 | Admitting: Oncology

## 2018-07-07 ENCOUNTER — Ambulatory Visit: Payer: 59

## 2018-07-17 ENCOUNTER — Other Ambulatory Visit: Payer: Self-pay | Admitting: Urology

## 2018-07-17 DIAGNOSIS — C61 Malignant neoplasm of prostate: Secondary | ICD-10-CM

## 2018-07-18 ENCOUNTER — Other Ambulatory Visit (INDEPENDENT_AMBULATORY_CARE_PROVIDER_SITE_OTHER): Payer: 59

## 2018-07-18 DIAGNOSIS — E785 Hyperlipidemia, unspecified: Secondary | ICD-10-CM

## 2018-07-18 LAB — HEPATIC FUNCTION PANEL
ALT: 21 U/L (ref 0–53)
AST: 17 U/L (ref 0–37)
Albumin: 4.1 g/dL (ref 3.5–5.2)
Alkaline Phosphatase: 84 U/L (ref 39–117)
Bilirubin, Direct: 0.1 mg/dL (ref 0.0–0.3)
TOTAL PROTEIN: 6.6 g/dL (ref 6.0–8.3)
Total Bilirubin: 0.6 mg/dL (ref 0.2–1.2)

## 2018-07-18 LAB — LDL CHOLESTEROL, DIRECT: LDL DIRECT: 119 mg/dL

## 2018-07-21 ENCOUNTER — Other Ambulatory Visit: Payer: Self-pay

## 2018-07-21 MED ORDER — ROSUVASTATIN CALCIUM 40 MG PO TABS: 40.0000 mg | ORAL_TABLET | Freq: Every day | ORAL | 3 refills | Status: DC

## 2018-07-28 ENCOUNTER — Encounter (HOSPITAL_COMMUNITY)
Admission: RE | Admit: 2018-07-28 | Discharge: 2018-07-28 | Disposition: A | Discharge status: Home / Self Care | Payer: 59 | Source: Ambulatory Visit | Attending: Urology | Admitting: Urology

## 2018-07-28 ENCOUNTER — Ambulatory Visit (HOSPITAL_COMMUNITY)
Admission: RE | Admit: 2018-07-28 | Discharge: 2018-07-28 | Disposition: A | Discharge status: Home / Self Care | Payer: 59 | Source: Ambulatory Visit | Attending: Urology | Admitting: Urology

## 2018-07-28 DIAGNOSIS — C61 Malignant neoplasm of prostate: Secondary | ICD-10-CM | POA: Insufficient documentation

## 2018-07-28 MED ORDER — TECHNETIUM TC 99M MEDRONATE IV KIT
21.2000 | PACK | Freq: Once | INTRAVENOUS | Status: AC | PRN
  Administered 2018-07-28: 21.2 via INTRAVENOUS

## 2018-08-05 ENCOUNTER — Other Ambulatory Visit (HOSPITAL_COMMUNITY): Payer: Self-pay | Admitting: Urology

## 2018-08-05 ENCOUNTER — Ambulatory Visit (HOSPITAL_COMMUNITY)
Admission: RE | Admit: 2018-08-05 | Discharge: 2018-08-05 | Disposition: A | Discharge status: Home / Self Care | Payer: 59 | Source: Ambulatory Visit | Attending: Urology | Admitting: Urology

## 2018-08-05 DIAGNOSIS — C61 Malignant neoplasm of prostate: Secondary | ICD-10-CM | POA: Insufficient documentation

## 2018-08-22 ENCOUNTER — Telehealth: Payer: Self-pay | Admitting: Radiology

## 2018-08-22 DIAGNOSIS — E785 Hyperlipidemia, unspecified: Secondary | ICD-10-CM

## 2018-08-22 NOTE — Addendum Note (Signed)
Addended by: Leone Haven on: 08/22/2018 05:41 PM   Modules accepted: Orders

## 2018-08-22 NOTE — Telephone Encounter (Signed)
Pt coming in for labs Monday, please place future orders. Thank you 

## 2018-08-25 ENCOUNTER — Other Ambulatory Visit (INDEPENDENT_AMBULATORY_CARE_PROVIDER_SITE_OTHER): Payer: 59

## 2018-08-25 DIAGNOSIS — E785 Hyperlipidemia, unspecified: Secondary | ICD-10-CM

## 2018-08-25 LAB — HEPATIC FUNCTION PANEL
ALT: 15 U/L (ref 0–53)
AST: 17 U/L (ref 0–37)
Albumin: 4.1 g/dL (ref 3.5–5.2)
Alkaline Phosphatase: 89 U/L (ref 39–117)
Bilirubin, Direct: 0.1 mg/dL (ref 0.0–0.3)
Total Bilirubin: 0.7 mg/dL (ref 0.2–1.2)
Total Protein: 6.5 g/dL (ref 6.0–8.3)

## 2018-08-25 LAB — LDL CHOLESTEROL, DIRECT: Direct LDL: 93 mg/dL

## 2018-09-03 ENCOUNTER — Other Ambulatory Visit: Payer: Self-pay | Admitting: Family Medicine

## 2018-09-16 ENCOUNTER — Other Ambulatory Visit: Payer: Self-pay | Admitting: Family Medicine

## 2018-10-02 DIAGNOSIS — C61 Malignant neoplasm of prostate: Secondary | ICD-10-CM | POA: Diagnosis not present

## 2018-10-07 ENCOUNTER — Other Ambulatory Visit: Payer: Self-pay | Admitting: Urology

## 2018-11-03 ENCOUNTER — Telehealth: Payer: Self-pay

## 2018-11-03 DIAGNOSIS — K649 Unspecified hemorrhoids: Secondary | ICD-10-CM

## 2018-11-03 NOTE — Telephone Encounter (Signed)
Copied from [place] [id number]. Topic: Referral - Request for Referral >> Nov 03, 2018  1:12 PM Alanda Slim E wrote: Has patient seen PCP for this complaint? No  *If NO, is insurance requiring patient see PCP for this issue before PCP can refer them? Referral for which specialty:  Preferred provider/office: [facility] / Dr. Tollie Pizza Reason for referral: Pt has a Hemorid

## 2018-11-03 NOTE — Patient Instructions (Addendum)
Cory [name]    Your procedure is scheduled on: 11-14-2018  Report to [facility] Main  Entrance  Report to admitting at 1100 AM    Call this number if you have problems the morning of surgery [phone]   Remember: Do not eat food after Midnight. Clear liquids from midnight until 700 am day of surgery. BRUSH YOUR TEETH MORNING OF SURGERY AND RINSE YOUR MOUTH OUT, NO CHEWING GUM CANDY OR MINTS.     CLEAR LIQUID DIET   Foods Allowed                                                                     Foods Excluded  Coffee and tea, regular and decaf                             liquids that you cannot  Plain Jell-O in any flavor                                             see through such as: Fruit ices (not with fruit pulp)                                     milk, soups, orange juice  Iced Popsicles                                    All solid food Carbonated beverages, regular and diet                                    Cranberry, grape and apple juices Sports drinks like Gatorade Lightly seasoned clear broth or consume(fat free) Sugar, honey syrup  Sample Menu Breakfast                                Lunch                                     Supper Cranberry juice                    Beef broth                            Chicken broth Jell-O                                     Grape juice  Apple juice Coffee or tea                        Jell-O                                      Popsicle                                                Coffee or tea                        Coffee or tea  _____________________________________________________________________     Take these medicines the morning of surgery with A SIP OF WATER: rosuvastatin (crestor), levothyroxine (synthroid)              You may not have any metal on your body including hair pins and              piercings  Do not wear jewelry, make-up, lotions, powders or  perfumes, deodorant                         Men may shave face and neck.   Do not bring valuables to the hospital. [place].  Contacts, dentures or bridgework may not be worn into surgery.  Leave suitcase in the car. After surgery it may be brought to your room.     Patients discharged the day of surgery will not be allowed to drive home. IF YOU ARE HAVING SURGERY AND GOING HOME THE SAME DAY, YOU MUST HAVE AN ADULT TO DRIVE YOU HOME AND BE WITH YOU FOR 24 HOURS. YOU MAY GO HOME BY TAXI OR UBER OR ORTHERWISE, BUT AN ADULT MUST ACCOMPANY YOU HOME AND STAY WITH YOU FOR 24 HOURS.  Name and phone number of your driver: [place] [phone]  Special Instructions: N/A              Please read over the following fact sheets you were given: _____________________________________________________________________             [facility] - Preparing for Surgery Before surgery, you can play an important role.  Because skin is not sterile, your skin needs to be as free of germs as possible.  You can reduce the number of germs on your skin by washing with CHG (chlorahexidine gluconate) soap before surgery.  CHG is an antiseptic cleaner which kills germs and bonds with the skin to continue killing germs even after washing. Please DO NOT use if you have an allergy to CHG or antibacterial soaps.  If your skin becomes reddened/irritated stop using the CHG and inform your nurse when you arrive at Short Stay. Do not shave (including legs and underarms) for at least 48 hours prior to the first CHG shower.  You may shave your face/neck. Please follow these instructions carefully:  1.  Shower with CHG Soap the night before surgery and the  morning of Surgery.  2.  If you choose to wash your hair, wash your hair first as usual with your  normal  shampoo.  3.  After you shampoo, rinse your hair and body thoroughly to remove the  shampoo.                            4.  Use CHG as you would any other liquid soap.  You can apply chg directly  to the skin and wash                       Gently with a scrungie or clean washcloth.  5.  Apply the CHG Soap to your body ONLY FROM THE NECK DOWN.   Do not use on face/ open                           Wound or open sores. Avoid contact with eyes, ears mouth and genitals (private parts).                       Wash face,  Genitals (private parts) with your normal soap.             6.  Wash thoroughly, paying special attention to the area where your surgery  will be performed.  7.  Thoroughly rinse your body with warm water from the neck down.  8.  DO NOT shower/wash with your normal soap after using and rinsing off  the CHG Soap.                9.  Pat yourself dry with a clean towel.            10.  Wear clean pajamas.            11.  Place clean sheets on your bed the night of your first shower and do not  sleep with pets. Day of Surgery : Do not apply any lotions/deodorants the morning of surgery.  Please wear clean clothes to the hospital/surgery center.  FAILURE TO FOLLOW THESE INSTRUCTIONS MAY RESULT IN THE CANCELLATION OF YOUR SURGERY PATIENT SIGNATURE_________________________________  NURSE SIGNATURE__________________________________  ________________________________________________________________________

## 2018-11-03 NOTE — Telephone Encounter (Signed)
Sent to PCP to place referral  

## 2018-11-03 NOTE — Telephone Encounter (Signed)
I have placed this referral.  Please find out what symptoms he has regarding the hemorrhoid.

## 2018-11-03 NOTE — Addendum Note (Signed)
Addended by: Caryl Bis, ERIC G on: 11/03/2018 05:00 PM   Modules accepted: Orders

## 2018-11-04 NOTE — Telephone Encounter (Signed)
Sent to Melissa  

## 2018-11-04 NOTE — Telephone Encounter (Signed)
Noted. I will forward to Melissa to see if she can get him in to be seen, though if they are not able to get him in soon he may need to be evaluated at urgent care.

## 2018-11-04 NOTE — Telephone Encounter (Signed)
Called and spoke with patient. Pt is c/o pain and bleeding no other signs or symptoms. Pt advised referral was placed.

## 2018-11-04 NOTE — Telephone Encounter (Signed)
Referral was sent to Dr. Bary Castilla. He is reviewing the referral.

## 2018-11-05 NOTE — Progress Notes (Signed)
SPOKE W/  _left message     SCREENING SYMPTOMS OF COVID 19:   COUGH--  RUNNY NOSE---   SORE THROAT---  NASAL CONGESTION----  SNEEZING----  SHORTNESS OF BREATH---  DIFFICULTY BREATHING---  TEMP >100.4-----  UNEXPLAINED BODY ACHES------   HAVE YOU OR ANY FAMILY MEMBER TRAVELLED PAST 14 DAYS OUT OF THE   COUNTY--- STATE---- COUNTRY----  HAVE YOU OR ANY FAMILY MEMBER BEEN EXPOSED TO ANYONE WITH COVID 19?

## 2018-11-06 ENCOUNTER — Other Ambulatory Visit: Payer: Self-pay

## 2018-11-06 ENCOUNTER — Inpatient Hospital Stay (HOSPITAL_COMMUNITY): Admission: RE | Admit: 2018-11-06 | Payer: 59 | Source: Ambulatory Visit

## 2018-11-06 ENCOUNTER — Encounter (HOSPITAL_COMMUNITY): Payer: Self-pay

## 2018-11-06 ENCOUNTER — Encounter (HOSPITAL_COMMUNITY)
Admission: RE | Admit: 2018-11-06 | Discharge: 2018-11-06 | Disposition: A | Discharge status: Home / Self Care | Payer: 59 | Source: Ambulatory Visit | Attending: Urology | Admitting: Urology

## 2018-11-06 DIAGNOSIS — Z01812 Encounter for preprocedural laboratory examination: Secondary | ICD-10-CM | POA: Diagnosis not present

## 2018-11-06 DIAGNOSIS — C61 Malignant neoplasm of prostate: Secondary | ICD-10-CM | POA: Diagnosis not present

## 2018-11-06 HISTORY — DX: Gastro-esophageal reflux disease without esophagitis: K21.9

## 2018-11-06 LAB — BASIC METABOLIC PANEL
Anion gap: 7 (ref 5–15)
BUN: 14 mg/dL (ref 6–20)
CO2: 25 mmol/L (ref 22–32)
Calcium: 8.8 mg/dL — ABNORMAL LOW (ref 8.9–10.3)
Chloride: 104 mmol/L (ref 98–111)
Creatinine, Ser: 1.08 mg/dL (ref 0.61–1.24)
GFR calc Af Amer: >60 mL/min (ref >60)
GFR calc non Af Amer: >60 mL/min (ref >60)
Glucose, Bld: 90 mg/dL (ref 70–99)
Potassium: 4.1 mmol/L (ref 3.5–5.1)
Sodium: 136 mmol/L (ref 135–145)

## 2018-11-06 LAB — CBC
HCT: 42.7 % (ref 39.0–52.0)
Hemoglobin: 14 g/dL (ref 13.0–17.0)
MCH: 30 pg (ref 26.0–34.0)
MCHC: 32.8 g/dL (ref 30.0–36.0)
MCV: 91.4 fL (ref 80.0–100.0)
Platelets: 288 10*3/uL (ref 150–400)
RBC: 4.67 MIL/uL (ref 4.22–5.81)
RDW: 12.9 % (ref 11.5–15.5)
WBC: 6.2 10*3/uL (ref 4.0–10.5)
nRBC: 0 % (ref 0.0–0.2)

## 2018-11-11 ENCOUNTER — Other Ambulatory Visit: Payer: Self-pay

## 2018-11-11 ENCOUNTER — Ambulatory Visit (INDEPENDENT_AMBULATORY_CARE_PROVIDER_SITE_OTHER): Payer: 59 | Admitting: General Surgery

## 2018-11-11 ENCOUNTER — Encounter: Payer: Self-pay | Admitting: General Surgery

## 2018-11-11 VITALS — BP 126/71 | HR 97 | Temp 97.7°F | Ht 68.0 in | Wt 168.0 lb

## 2018-11-11 DIAGNOSIS — K648 Other hemorrhoids: Secondary | ICD-10-CM

## 2018-11-11 MED ORDER — HYDROCORT-PRAMOXINE (PERIANAL) 2.5-1 % EX CREA: TOPICAL_CREAM | CUTANEOUS | 1 refills | Status: DC

## 2018-11-11 NOTE — Patient Instructions (Addendum)
May try using Tucks pads for comfort.   Pick up your prescription  Try not to sit on the toilet any longer than necessary.

## 2018-11-11 NOTE — Progress Notes (Signed)
Patient ID: Cory [name], male   DOB: 1961-03-31, 58 y.o.   MRN: [id number]  Chief Complaint  Patient presents with  . Nausea    hemorrhoids    HPI Cory [name] is a 58 y.o. male here for evaluation of hemorrhoids. He has had problems with them for several years. In the last two weeks they have faired up badly with bleeding. Today they seem better. He has not been using any medications.  HPI  Past Medical History:  Diagnosis Date  . Benign localized prostatic hyperplasia with lower urinary tract symptoms (LUTS)   . COPD (chronic obstructive pulmonary disease) (HCC)    MILD NO INHALER USED  . Diverticulosis of colon   . GERD (gastroesophageal reflux disease)   . History of adenomatous polyp of colon    2013  . History of pneumothorax    1989--  s/p right thoractomy for bleb HAD TOTAL OF 3 TIMES , RIGHT LUNG X 2 LEFT X 1  . Hypothyroidism   . Insomnia   . Prostate cancer [facility]) dx 02-26-2016 via bx--  urologist-  dr Tresa Moore  oncologist-  dr Tammi Klippel   Stage T2a, [name] 7,  PSA 5.56,  vol 44cc    Past Surgical History:  Procedure Laterality Date  . COLONOSCOPY  last one 10-09-2016  . CYSTOSCOPY N/A 10/19/2016   Procedure: CYSTOSCOPY FLEXIBLE;  Surgeon: Alexis Frock, MD;  Location: [facility];  Service: Urology;  Laterality: N/A;  no seeds found in bladder  . PROSTATE BIOPSY  01/2016 AND JAN 2020  . RADIOACTIVE SEED IMPLANT N/A 10/19/2016   Procedure: RADIOACTIVE SEED IMPLANT/BRACHYTHERAPY IMPLANT SPACEOAR PLACEMENT;  Surgeon: Alexis Frock, MD;  Location: [facility];  Service: Urology;  Laterality: N/A;   seeds implanted--77   . THORACOTOMY Right 1989   Bleb / pneumothorax     Family History  Problem Relation Age of Onset  . Lung cancer Mother 55  . Hyperlipidemia Mother   . Hypertension Mother   . Thyroid cancer Sister 63       thyroid/evironmental    Social History Social History   Tobacco Use  . Smoking status: Former  Smoker    Packs/day: 1.00    Years: 35.00    Pack years: 35.00    Types: Cigarettes    Last attempt to quit: 07/30/2013    Years since quitting: 5.2  . Smokeless tobacco: Never Used  Substance Use Topics  . Alcohol use: Yes    Alcohol/week: 1.0 standard drinks    Types: 1 Cans of beer per week    Comment: FEW BEERS WEEK  . Drug use: No    No Known Allergies  Current Outpatient Medications  Medication Sig Dispense Refill  . famciclovir (FAMVIR) 500 MG tablet TAKE 1 TABLET (500 MG TOTAL) BY MOUTH 2 (TWO) TIMES DAILY. FOR 7 DAYS AT FIRST SIGN OF COLD SORE. (Patient taking differently: Take 500 mg by mouth 2 (two) times daily as needed (cold sore). For 7 days at first sign of cold sore.) 14 tablet 1  . levothyroxine (SYNTHROID, LEVOTHROID) 112 MCG tablet TAKE 1 TABLET BY MOUTH EVERY DAY (Patient taking differently: Take 112 mcg by mouth daily before breakfast. ) 90 tablet 1  . mirtazapine (REMERON) 30 MG tablet TAKE 1 TABLET BY MOUTH EVERY DAY AT BEDTIME (Patient taking differently: Take 30 mg by mouth at bedtime. TAKE 1 TABLET BY MOUTH EVERY DAY AT BEDTIME) 90 tablet 1  . PARoxetine (PAXIL) 20 MG tablet  Take 20 mg by mouth daily as needed (ED).    . rosuvastatin (CRESTOR) 40 MG tablet Take 1 tablet (40 mg total) by mouth daily. 90 tablet 3  . hydrocortisone-pramoxine (ANALPRAM-HC) 2.5-1 % rectal cream Apply a small amount to the external anal tissue three times a day if needed for comfort. 30 g 1   No current facility-administered medications for this visit.     Review of Systems Review of Systems  Constitutional: Negative.   Respiratory: Negative.   Cardiovascular: Negative.   Gastrointestinal: Positive for nausea and rectal pain. Negative for abdominal distention, abdominal pain, anal bleeding, blood in stool, constipation, diarrhea and vomiting.    Blood pressure 126/71, pulse 97, temperature 97.7 F (36.5 C), height 5\' 8"  (1.727 m), weight 168 lb (76.2 kg), SpO2 96  %.  Physical Exam Physical Exam Constitutional:      Appearance: He is well-developed.  Eyes:     General: No scleral icterus.    Conjunctiva/sclera: Conjunctivae normal.  Neck:     Musculoskeletal: Neck supple.  Cardiovascular:     Rate and Rhythm: Normal rate and regular rhythm.     Heart sounds: Normal heart sounds.  Pulmonary:     Effort: Pulmonary effort is normal.     Breath sounds: Normal breath sounds.  Abdominal:     Tenderness: There is no abdominal tenderness.  Genitourinary:    Rectum: Internal hemorrhoid present.    Lymphadenopathy:     Cervical: No cervical adenopathy.  Skin:    General: Skin is warm and dry.  Neurological:     Mental Status: He is alert and oriented to person, place, and time.     Data Reviewed Anoscopy was completed: Small, less than 1 cm internal hemorrhoids noted anteriorly.  No anal fissure.  No active bleeding.  Visualized rectal mucosa was normal.  Small superficial ulcerations on the left side from trauma of perianal cleansing.  Assessment Minimal internal hemorrhoids, no evidence of rectal mucosa inflammation from previous brachii therapy.  External skin tags, no evidence of clot.  Plan  Symptomatic management of the redundant anal skin is recommended at this time.  Tucks pads should be flushable and allow him to be a little more gentle on perianal cleansing.  No indication for surgical intervention at this time.   Prescription for Analpram cream to use 2-3 times a day as needed for comfort provided.  HPI, Physical Exam, Assessment and Plan have been scribed under the direction and in the presence of Cory Bellow, MD  Cory Living, LPN  I have completed the exam and reviewed the above documentation for accuracy and completeness.  I agree with the above.  Haematologist has been used and any errors in dictation or transcription are unintentional.  Cory [name], M.D., F.A.C.S.  Cory [name]  Cory [name] 11/12/2018, 9:19 AM

## 2018-11-12 DIAGNOSIS — K648 Other hemorrhoids: Secondary | ICD-10-CM | POA: Insufficient documentation

## 2018-11-13 NOTE — Progress Notes (Signed)
Anesthesia Chart Review   Case:  [id number] Date/Time:  11/14/18 1245   Procedure:  CRYO ABLATION PROSTATE (N/A ) - 90 MINS   Anesthesia type:  General   Pre-op diagnosis:  PROSTATE CANCER   Location:  [facility] ROOM 08 / [facility]   Surgeon:  Cleon Gustin, MD      DISCUSSION: 58 yo former smoker (35 pack years, quit 07/30/13) with h/o hypothyroidism, GERD, COPD, h/o pneumothoraces bilaterally 1980s, prostate cancer scheduled for above procedure 11/14/18 with Dr. Nicolette Bang.    Anesthesia records reviewed, s/p radioactive seed implant 10/19/2016 under general anesthesia with no anesthesia complications noted.   Pt last seen by PCP 06/17/18, stable at this visit.  Pt can proceed with planned procedure barring acute status change.  VS: BP 130/84   Pulse 69   Temp 37.1 C (Oral)   Resp 16   Ht 5\' 8"  (1.727 m)   Wt 72.6 kg   SpO2 97%   BMI 24.33 kg/m   PROVIDERS: Leone Haven, MD is PCP last seen 06/17/18   LABS: Labs reviewed: Acceptable for surgery. (all labs ordered are listed, but only abnormal results are displayed)  Labs Reviewed  BASIC METABOLIC PANEL - Abnormal; Notable for the following components:      Result Value   Calcium 8.8 (*)    All other components within normal limits  CBC     IMAGES:   EKG: 08/31/16 Rate 80 bpm Normal sinus rhythm Normal ECG  CV:  Past Medical History:  Diagnosis Date  . Benign localized prostatic hyperplasia with lower urinary tract symptoms (LUTS)   . COPD (chronic obstructive pulmonary disease) (HCC)    MILD NO INHALER USED  . Diverticulosis of colon   . GERD (gastroesophageal reflux disease)   . History of adenomatous polyp of colon    2013  . History of pneumothorax    1989--  s/p right thoractomy for bleb HAD TOTAL OF 3 TIMES , RIGHT LUNG X 2 LEFT X 1  . Hypothyroidism   . Insomnia   . Prostate cancer [facility]) dx 02-26-2016 via bx--  urologist-  dr Tresa Moore  oncologist-  dr Tammi Klippel   Stage T2a, Gleason 7,  PSA 5.56,   vol 44cc    Past Surgical History:  Procedure Laterality Date  . COLONOSCOPY  last one 10-09-2016  . CYSTOSCOPY N/A 10/19/2016   Procedure: CYSTOSCOPY FLEXIBLE;  Surgeon: Alexis Frock, MD;  Location: [facility];  Service: Urology;  Laterality: N/A;  no seeds found in bladder  . PROSTATE BIOPSY  01/2016 AND JAN 2020  . RADIOACTIVE SEED IMPLANT N/A 10/19/2016   Procedure: RADIOACTIVE SEED IMPLANT/BRACHYTHERAPY IMPLANT SPACEOAR PLACEMENT;  Surgeon: Alexis Frock, MD;  Location: [facility];  Service: Urology;  Laterality: N/A;   seeds implanted--77   . THORACOTOMY Right 1989   Bleb / pneumothorax     MEDICATIONS: . famciclovir (FAMVIR) 500 MG tablet  . hydrocortisone-pramoxine (ANALPRAM-HC) 2.5-1 % rectal cream  . levothyroxine (SYNTHROID, LEVOTHROID) 112 MCG tablet  . mirtazapine (REMERON) 30 MG tablet  . PARoxetine (PAXIL) 20 MG tablet  . rosuvastatin (CRESTOR) 40 MG tablet   No current facility-administered medications for this encounter.     Maia Plan [facility] Pre-Surgical Testing [phone] 11/13/18 1:34 PM

## 2018-11-14 ENCOUNTER — Other Ambulatory Visit: Payer: Self-pay

## 2018-11-14 ENCOUNTER — Ambulatory Visit (HOSPITAL_COMMUNITY)
Admission: RE | Admit: 2018-11-14 | Discharge: 2018-11-14 | Disposition: A | Discharge status: Home / Self Care | Payer: 59 | Attending: Urology | Admitting: Urology

## 2018-11-14 ENCOUNTER — Encounter (HOSPITAL_COMMUNITY)
Admission: RE | Disposition: A | Discharge status: Home / Self Care | Payer: Self-pay | Source: Home / Self Care | Attending: Urology

## 2018-11-14 ENCOUNTER — Ambulatory Visit (HOSPITAL_COMMUNITY): Payer: 59 | Admitting: Physician Assistant

## 2018-11-14 ENCOUNTER — Encounter (HOSPITAL_COMMUNITY): Payer: Self-pay | Admitting: *Deleted

## 2018-11-14 ENCOUNTER — Ambulatory Visit (HOSPITAL_COMMUNITY): Payer: 59 | Admitting: Anesthesiology

## 2018-11-14 DIAGNOSIS — Z923 Personal history of irradiation: Secondary | ICD-10-CM | POA: Insufficient documentation

## 2018-11-14 DIAGNOSIS — N529 Male erectile dysfunction, unspecified: Secondary | ICD-10-CM | POA: Insufficient documentation

## 2018-11-14 DIAGNOSIS — Z87891 Personal history of nicotine dependence: Secondary | ICD-10-CM | POA: Insufficient documentation

## 2018-11-14 DIAGNOSIS — Z8546 Personal history of malignant neoplasm of prostate: Secondary | ICD-10-CM | POA: Diagnosis not present

## 2018-11-14 DIAGNOSIS — E039 Hypothyroidism, unspecified: Secondary | ICD-10-CM | POA: Diagnosis not present

## 2018-11-14 DIAGNOSIS — C61 Malignant neoplasm of prostate: Secondary | ICD-10-CM | POA: Insufficient documentation

## 2018-11-14 DIAGNOSIS — J449 Chronic obstructive pulmonary disease, unspecified: Secondary | ICD-10-CM | POA: Diagnosis not present

## 2018-11-14 DIAGNOSIS — E785 Hyperlipidemia, unspecified: Secondary | ICD-10-CM | POA: Diagnosis not present

## 2018-11-14 HISTORY — PX: CRYOABLATION: SHX1415

## 2018-11-14 SURGERY — CRYOABLATION, PROSTATE
Anesthesia: General

## 2018-11-14 MED ORDER — FENTANYL CITRATE (PF) 250 MCG/5ML IJ SOLN
INTRAMUSCULAR | Status: DC | PRN
  Administered 2018-11-14 (×4): 50 ug via INTRAVENOUS

## 2018-11-14 MED ORDER — SUGAMMADEX SODIUM 200 MG/2ML IV SOLN
INTRAVENOUS | Status: AC
  Filled 2018-11-14: qty 2

## 2018-11-14 MED ORDER — GLYCOPYRROLATE PF 0.2 MG/ML IJ SOSY
PREFILLED_SYRINGE | INTRAMUSCULAR | Status: AC
  Filled 2018-11-14: qty 1

## 2018-11-14 MED ORDER — HYDROCODONE-ACETAMINOPHEN 5-325 MG PO TABS: 1.0000 | ORAL_TABLET | ORAL | 0 refills | Status: DC | PRN

## 2018-11-14 MED ORDER — ONDANSETRON HCL 4 MG/2ML IJ SOLN
INTRAMUSCULAR | Status: AC
  Filled 2018-11-14: qty 2

## 2018-11-14 MED ORDER — LIDOCAINE 2% (20 MG/ML) 5 ML SYRINGE
INTRAMUSCULAR | Status: DC | PRN
  Administered 2018-11-14: 80 mg via INTRAVENOUS

## 2018-11-14 MED ORDER — MIDAZOLAM HCL 5 MG/5ML IJ SOLN
INTRAMUSCULAR | Status: DC | PRN
  Administered 2018-11-14: 2 mg via INTRAVENOUS

## 2018-11-14 MED ORDER — SUCCINYLCHOLINE CHLORIDE 200 MG/10ML IV SOSY
PREFILLED_SYRINGE | INTRAVENOUS | Status: DC | PRN
  Administered 2018-11-14: 100 mg via INTRAVENOUS

## 2018-11-14 MED ORDER — ROCURONIUM BROMIDE 10 MG/ML (PF) SYRINGE
PREFILLED_SYRINGE | INTRAVENOUS | Status: AC
  Filled 2018-11-14: qty 10

## 2018-11-14 MED ORDER — FENTANYL CITRATE (PF) 100 MCG/2ML IJ SOLN
INTRAMUSCULAR | Status: AC
  Filled 2018-11-14: qty 2

## 2018-11-14 MED ORDER — BACITRACIN ZINC 500 UNIT/GM EX OINT
TOPICAL_OINTMENT | CUTANEOUS | Status: DC | PRN
  Administered 2018-11-14: 1 via TOPICAL

## 2018-11-14 MED ORDER — ONDANSETRON HCL 4 MG/2ML IJ SOLN
INTRAMUSCULAR | Status: DC | PRN
  Administered 2018-11-14: 4 mg via INTRAVENOUS

## 2018-11-14 MED ORDER — FENTANYL CITRATE (PF) 100 MCG/2ML IJ SOLN
25.0000 ug | INTRAMUSCULAR | Status: DC | PRN
  Administered 2018-11-14: 15:00:00 50 ug via INTRAVENOUS

## 2018-11-14 MED ORDER — DEXAMETHASONE SODIUM PHOSPHATE 10 MG/ML IJ SOLN
INTRAMUSCULAR | Status: AC
  Filled 2018-11-14: qty 1

## 2018-11-14 MED ORDER — BELLADONNA ALKALOIDS-OPIUM 16.2-60 MG RE SUPP
RECTAL | Status: AC
  Filled 2018-11-14: qty 1

## 2018-11-14 MED ORDER — OXYCODONE HCL 5 MG/5ML PO SOLN: 5.0000 mg | Freq: Once | ORAL | Status: AC | PRN

## 2018-11-14 MED ORDER — LIDOCAINE 2% (20 MG/ML) 5 ML SYRINGE
INTRAMUSCULAR | Status: AC
  Filled 2018-11-14: qty 5

## 2018-11-14 MED ORDER — CEFAZOLIN SODIUM-DEXTROSE 2-4 GM/100ML-% IV SOLN
2.0000 g | INTRAVENOUS | Status: AC
  Administered 2018-11-14: 13:00:00 2 g via INTRAVENOUS
  Filled 2018-11-14: qty 100

## 2018-11-14 MED ORDER — SUGAMMADEX SODIUM 200 MG/2ML IV SOLN
INTRAVENOUS | Status: DC | PRN
  Administered 2018-11-14: 200 mg via INTRAVENOUS

## 2018-11-14 MED ORDER — PROMETHAZINE HCL 25 MG/ML IJ SOLN: 6.2500 mg | INTRAMUSCULAR | Status: DC | PRN

## 2018-11-14 MED ORDER — OXYCODONE HCL 5 MG PO TABS
5.0000 mg | ORAL_TABLET | Freq: Once | ORAL | Status: AC | PRN
  Administered 2018-11-14: 5 mg via ORAL

## 2018-11-14 MED ORDER — LACTATED RINGERS IV SOLN
INTRAVENOUS | Status: DC
  Administered 2018-11-14: 12:00:00 via INTRAVENOUS

## 2018-11-14 MED ORDER — DEXAMETHASONE SODIUM PHOSPHATE 10 MG/ML IJ SOLN
INTRAMUSCULAR | Status: DC | PRN
  Administered 2018-11-14: 10 mg via INTRAVENOUS

## 2018-11-14 MED ORDER — ROCURONIUM BROMIDE 10 MG/ML (PF) SYRINGE
PREFILLED_SYRINGE | INTRAVENOUS | Status: DC | PRN
  Administered 2018-11-14: 40 mg via INTRAVENOUS

## 2018-11-14 MED ORDER — EPHEDRINE 5 MG/ML INJ
INTRAVENOUS | Status: AC
  Filled 2018-11-14: qty 10

## 2018-11-14 MED ORDER — STERILE WATER FOR IRRIGATION IR SOLN
Status: DC | PRN
  Administered 2018-11-14: 1000 mL

## 2018-11-14 MED ORDER — PROPOFOL 10 MG/ML IV BOLUS
INTRAVENOUS | Status: DC | PRN
  Administered 2018-11-14: 180 mg via INTRAVENOUS

## 2018-11-14 MED ORDER — BACITRACIN ZINC 500 UNIT/GM EX OINT
TOPICAL_OINTMENT | CUTANEOUS | Status: AC
  Filled 2018-11-14: qty 28.35

## 2018-11-14 MED ORDER — BELLADONNA ALKALOIDS-OPIUM 16.2-60 MG RE SUPP
1.0000 | Freq: Once | RECTAL | Status: AC
  Administered 2018-11-14: 16:00:00 1 via RECTAL

## 2018-11-14 MED ORDER — EPHEDRINE SULFATE-NACL 50-0.9 MG/10ML-% IV SOSY
PREFILLED_SYRINGE | INTRAVENOUS | Status: DC | PRN
  Administered 2018-11-14: 10 mg via INTRAVENOUS
  Administered 2018-11-14 (×5): 5 mg via INTRAVENOUS

## 2018-11-14 MED ORDER — PROPOFOL 10 MG/ML IV BOLUS
INTRAVENOUS | Status: AC
  Filled 2018-11-14: qty 20

## 2018-11-14 MED ORDER — SODIUM CHLORIDE 0.9 % IR SOLN
Status: DC | PRN
  Administered 2018-11-14: 1000 mL

## 2018-11-14 MED ORDER — SUCCINYLCHOLINE CHLORIDE 200 MG/10ML IV SOSY
PREFILLED_SYRINGE | INTRAVENOUS | Status: AC
  Filled 2018-11-14: qty 10

## 2018-11-14 MED ORDER — MIDAZOLAM HCL 2 MG/2ML IJ SOLN
INTRAMUSCULAR | Status: AC
  Filled 2018-11-14: qty 2

## 2018-11-14 MED ORDER — OXYCODONE HCL 5 MG PO TABS
ORAL_TABLET | ORAL | Status: AC
  Filled 2018-11-14: qty 1

## 2018-11-14 SURGICAL SUPPLY — 18 items
BAG URINE DRAINAGE (UROLOGICAL SUPPLIES) ×3 IMPLANT
CATH FOLEY 2WAY SLVR  5CC 18FR (CATHETERS) ×2
CATH FOLEY 2WAY SLVR 5CC 18FR (CATHETERS) ×1 IMPLANT
COVER SURGICAL LIGHT HANDLE (MISCELLANEOUS) ×3 IMPLANT
DRAPE INCISE IOBAN 66X45 STRL (DRAPES) ×3 IMPLANT
DRAPE U-SHAPE 47X51 STRL (DRAPES) ×3 IMPLANT
DRSG TEGADERM 4X4.75 (GAUZE/BANDAGES/DRESSINGS) ×3 IMPLANT
GAS ARGON HIGH PRESSURE (MEDICAL GASES) ×3 IMPLANT
GAS HELIUM HIGH PRESSURE (MEDICAL GASES) ×3 IMPLANT
GLOVE BIOGEL M STRL SZ7.5 (GLOVE) ×3 IMPLANT
GOWN STRL REUS W/TWL XL LVL3 (GOWN DISPOSABLE) ×3 IMPLANT
GUIDEWIRE AMPLATZ STIFF 0.35 (WIRE) ×3 IMPLANT
KIT CRYO ENDOCARE (DISPOSABLE) ×3 IMPLANT
KIT TURNOVER KIT A (KITS) IMPLANT
PACK CYSTO (CUSTOM PROCEDURE TRAY) ×3 IMPLANT
PLUG CATH AND CAP STER (CATHETERS) ×3 IMPLANT
SHEET LAVH (DRAPES) ×3 IMPLANT
TOWEL OR 17X26 10 PK STRL BLUE (TOWEL DISPOSABLE) ×3 IMPLANT

## 2018-11-14 NOTE — Anesthesia Preprocedure Evaluation (Addendum)
Anesthesia Evaluation  Patient identified by MRN, date of birth, ID band Patient awake    Reviewed: Allergy & Precautions, NPO status , Patient's Chart, lab work & pertinent test results  History of Anesthesia Complications Negative for: history of anesthetic complications  Airway Mallampati: II  TM Distance: >3 FB Neck ROM: Full    Dental  (+) Dental Advisory Given, Teeth Intact   Pulmonary COPD, former smoker,    breath sounds clear to auscultation       Cardiovascular Exercise Tolerance: Good negative cardio ROS   Rhythm:Regular Rate:Normal     Neuro/Psych negative neurological ROS  negative psych ROS   GI/Hepatic Neg liver ROS, GERD  Controlled,  Endo/Other  Hypothyroidism   Renal/GU negative Renal ROS    Prostate cancer     Musculoskeletal negative musculoskeletal ROS (+)   Abdominal   Peds  Hematology negative hematology ROS (+)   Anesthesia Other Findings   Reproductive/Obstetrics                            Anesthesia Physical Anesthesia Plan  ASA: II  Anesthesia Plan: General   Post-op Pain Management:    Induction: Intravenous  PONV Risk Score and Plan: 2 and Treatment may vary due to age or medical condition, Ondansetron, Dexamethasone and Midazolam  Airway Management Planned: LMA  Additional Equipment: None  Intra-op Plan:   Post-operative Plan: Extubation in OR  Informed Consent: I have reviewed the patients History and Physical, chart, labs and discussed the procedure including the risks, benefits and alternatives for the proposed anesthesia with the patient or authorized representative who has indicated his/her understanding and acceptance.     Dental advisory given  Plan Discussed with: CRNA and Anesthesiologist  Anesthesia Plan Comments:        Anesthesia Quick Evaluation

## 2018-11-14 NOTE — Discharge Instructions (Signed)

## 2018-11-14 NOTE — Op Note (Signed)
Pre-operative diagnosis : Adenocarcinoma prostate, T1c  Postoperative diagnosis: Same  Operation: Cryotherapy of the prostate  Surgeon:Andersen Iorio, MD  Anesthesia: General  Drains: 73 French foley catheter  Specimen: None  EBL: Minimal  Findings: 25cc prostate gland. No evidence of cryoprobes in the urethra or bladder on cystoscopy. hemiablation of the prostate with 4 cryoablation probes.  Indications: The patient is a 58yo with a history of recurrent prostate cancer found on repeat biopsy. He is status post brachytherapy for his initial prostate cancer treatment.  After discussing treatment options the patient elected to proceed with cryoablation of the prostate.  He has erectile dysfunction already.  Procedure in detail: Prior to procedure consent was obtained. The patient was brought to the operating room and a brief timeout was done ensuring the correct patient, correct procedure, and correct site.  After appropriate anesthesia, the patient was placed on the operative table in dorsal supine position where general LMA anesthesia was introduced. He was then placed in the dorsal lithotomy position with the pubis was prepped with Betadine solution and draped in usual fashion.   An 69 French Foley catheter is placed with 10 mL in the balloon. The patient then underwent 6 rows of cryotherapy probe insertion, under real time ultrasound control. When all the cryotherapy probes were placed, temperature probes were placed into an denonvilliers fascia, and periurethral tissue. Following this, cystourethroscopy was performed and this revealed no probe within the bladder or urethra. A guidewire was placed in the bladder through the flexible cystoscope prior to removal. Over the guidewire, the urethral warming device was placed, and a cap placed on the end. Following this, with the urethral warming device in place, the patient underwent 2 separate freeze-thaw cycles, using Argon gas and Helium  gas. The second cycle used active freezing, and active thaw process at the beginning, but then passive thaw process to finish. The urethral warming device was left in place for 10 extra minutes, prior to removal, and replacement with an 18 French Foley catheter placed to straight drainage with 10 mL of sterile water in the balloon. The patient received IV Tylenol and IV Toradol. This then concluded the procedure which was well tolerated by the patient. He was awakened and taken to recovery room in good condition.  Condition: Stable, extubated, transferred to PACU  Complications: None  Plan: Patient is to be discharged home. He is to followup in 1 week for foley catheter removal

## 2018-11-14 NOTE — Anesthesia Procedure Notes (Signed)
Procedure Name: Intubation Date/Time: 11/14/2018 1:27 PM Performed by: Mitzie Na, CRNA Pre-anesthesia Checklist: Patient identified, Emergency Drugs available, Suction available, Patient being monitored and Timeout performed Patient Re-evaluated:Patient Re-evaluated prior to induction Oxygen Delivery Method: Circle system utilized Preoxygenation: Pre-oxygenation with 100% oxygen Induction Type: IV induction and Rapid sequence Laryngoscope Size: Mac and 4 Grade View: Grade I Tube type: Oral Tube size: 7.5 mm Number of attempts: 1 Airway Equipment and Method: Stylet Placement Confirmation: ETT inserted through vocal cords under direct vision,  positive ETCO2 and breath sounds checked- equal and bilateral Secured at: 24 cm Tube secured with: Tape Dental Injury: Teeth and Oropharynx as per pre-operative assessment

## 2018-11-14 NOTE — H&P (Signed)
Urology Admission H&P  Chief Complaint: prostate cancer  History of Present Illness: Cory [name] is a 58yo with a history of prostate cancer treated previously with radiation therapy. He developed a biochemical recurrence and underwent metastatic survey which was negative. He underwent prostate biopsy with Dr. Tresa Moore. Prostate biopsy revealed prostate cancer at the right base.  Past Medical History:  Diagnosis Date  . Benign localized prostatic hyperplasia with lower urinary tract symptoms (LUTS)   . COPD (chronic obstructive pulmonary disease) (HCC)    MILD NO INHALER USED  . Diverticulosis of colon   . GERD (gastroesophageal reflux disease)   . History of adenomatous polyp of colon    2013  . History of pneumothorax    1989--  s/p right thoractomy for bleb HAD TOTAL OF 3 TIMES , RIGHT LUNG X 2 LEFT X 1  . Hypothyroidism   . Insomnia   . Prostate cancer [facility]) dx 02-26-2016 via bx--  urologist-  dr Tresa Moore  oncologist-  dr Tammi Klippel   Stage T2a, Gleason 7,  PSA 5.56,  vol 44cc   Past Surgical History:  Procedure Laterality Date  . COLONOSCOPY  last one 10-09-2016  . CYSTOSCOPY N/A 10/19/2016   Procedure: CYSTOSCOPY FLEXIBLE;  Surgeon: Alexis Frock, MD;  Location: [facility];  Service: Urology;  Laterality: N/A;  no seeds found in bladder  . PROSTATE BIOPSY  01/2016 AND JAN 2020  . RADIOACTIVE SEED IMPLANT N/A 10/19/2016   Procedure: RADIOACTIVE SEED IMPLANT/BRACHYTHERAPY IMPLANT SPACEOAR PLACEMENT;  Surgeon: Alexis Frock, MD;  Location: [facility];  Service: Urology;  Laterality: N/A;   seeds implanted--77   . THORACOTOMY Right 1989   Bleb / pneumothorax     Home Medications:  Current Facility-Administered Medications  Medication Dose Route Frequency Provider Last Rate Last Dose  . ceFAZolin (ANCEF) IVPB 2g/100 mL premix  2 g Intravenous 30 min Pre-Op McKenzie, Candee Furbish, MD      . lactated ringers infusion   Intravenous Continuous Audry Pili, MD  100 mL/hr at 11/14/18 1152     Allergies: No Known Allergies  Family History  Problem Relation Age of Onset  . Lung cancer Mother 71  . Hyperlipidemia Mother   . Hypertension Mother   . Thyroid cancer Sister 53       thyroid/evironmental   Social History:  reports that he quit smoking about 5 years ago. His smoking use included cigarettes. He has a 35.00 pack-year smoking history. He has never used smokeless tobacco. He reports current alcohol use of about 1.0 standard drinks of alcohol per week. He reports that he does not use drugs.  Review of Systems  All other systems reviewed and are negative.   Physical Exam:  Vital signs in last 24 hours: Temp:  [98.1 F (36.7 C)] 98.1 F (36.7 C) (04/17 1132) Pulse Rate:  [64] 64 (04/17 1132) Resp:  [16] 16 (04/17 1132) BP: (138)/(77) 138/77 (04/17 1132) SpO2:  [98 %] 98 % (04/17 1132) Physical Exam  Constitutional: He is oriented to person, place, and time. He appears well-developed and well-nourished.  HENT:  Head: Normocephalic and atraumatic.  Eyes: Pupils are equal, round, and reactive to light. EOM are normal.  Neck: Normal range of motion. No thyromegaly present.  Cardiovascular: Normal rate and regular rhythm.  Respiratory: Effort normal. No respiratory distress.  GI: Soft. He exhibits no distension.  Musculoskeletal: Normal range of motion.        General: No edema.  Neurological: He is  alert and oriented to person, place, and time.  Skin: Skin is warm and dry.  Psychiatric: He has a normal mood and affect. His behavior is normal. Judgment and thought content normal.    Laboratory Data:  No results found for this or any previous visit (from the past 24 hour(s)). No results found for this or any previous visit (from the past 240 hour(s)). Creatinine: No results for input(s): CREATININE in the last 168 hours. Baseline Creatinine: unknown  Impression/Assessment:  57yo with prostate cancer  Plan:  The  risks/benefits/alternatives to focal cryoablation of the prostate was explained to the patient and he understands and wishes to proceed with surgery  Nicolette Bang 11/14/2018, 12:31 PM

## 2018-11-14 NOTE — Anesthesia Postprocedure Evaluation (Signed)
Anesthesia Post Note  Patient: Cory [name]  Procedure(s) Performed: CRYO ABLATION PROSTATE (N/A )     Patient location during evaluation: PACU Anesthesia Type: General Level of consciousness: awake and alert Pain management: pain level controlled Vital Signs Assessment: post-procedure vital signs reviewed and stable Respiratory status: spontaneous breathing, nonlabored ventilation and respiratory function stable Cardiovascular status: blood pressure returned to baseline and stable Postop Assessment: no apparent nausea or vomiting Anesthetic complications: no    Last Vitals:  Vitals:   11/14/18 1512 11/14/18 1600  BP: (!) 160/80   Pulse: 94   Resp: 14   Temp: 36.4 C 36.6 C  SpO2: 100%     Last Pain:  Vitals:   11/14/18 1545  TempSrc:   PainSc: [place]

## 2018-11-14 NOTE — Transfer of Care (Signed)
Immediate Anesthesia Transfer of Care Note  Patient: Cory [name]  Procedure(s) Performed: CRYO ABLATION PROSTATE (N/A )  Patient Location: PACU  Anesthesia Type:General  Level of Consciousness: awake, alert , oriented and patient cooperative  Airway & Oxygen Therapy: Patient Spontanous Breathing and Patient connected to face mask oxygen  Post-op Assessment: Report given to RN, Post -op Vital signs reviewed and stable and Patient moving all extremities  Post vital signs: Reviewed and stable  Last Vitals:  Vitals Value Taken Time  BP 160/80 11/14/2018  3:12 PM  Temp    Pulse 93 11/14/2018  3:14 PM  Resp 11 11/14/2018  3:14 PM  SpO2 100 % 11/14/2018  3:14 PM  Vitals shown include unvalidated device data.  Last Pain:  Vitals:   11/14/18 1142  TempSrc:   PainSc: 0-No pain         Complications: No apparent anesthesia complications

## 2018-11-17 ENCOUNTER — Encounter (HOSPITAL_COMMUNITY): Payer: Self-pay | Admitting: Urology

## 2018-12-01 DIAGNOSIS — A4153 Sepsis due to Serratia: Secondary | ICD-10-CM | POA: Diagnosis not present

## 2018-12-01 DIAGNOSIS — N390 Urinary tract infection, site not specified: Secondary | ICD-10-CM | POA: Diagnosis not present

## 2018-12-01 DIAGNOSIS — C61 Malignant neoplasm of prostate: Secondary | ICD-10-CM | POA: Diagnosis not present

## 2018-12-23 ENCOUNTER — Other Ambulatory Visit: Payer: Self-pay

## 2018-12-26 ENCOUNTER — Ambulatory Visit (INDEPENDENT_AMBULATORY_CARE_PROVIDER_SITE_OTHER): Payer: 59 | Admitting: Family Medicine

## 2018-12-26 ENCOUNTER — Other Ambulatory Visit: Payer: Self-pay

## 2018-12-26 ENCOUNTER — Encounter: Payer: Self-pay | Admitting: Family Medicine

## 2018-12-26 VITALS — BP 122/78 | HR 67 | Temp 98.2°F | Ht 68.31 in | Wt 162.4 lb

## 2018-12-26 DIAGNOSIS — Z87891 Personal history of nicotine dependence: Secondary | ICD-10-CM | POA: Diagnosis not present

## 2018-12-26 DIAGNOSIS — C61 Malignant neoplasm of prostate: Secondary | ICD-10-CM

## 2018-12-26 DIAGNOSIS — Z0001 Encounter for general adult medical examination with abnormal findings: Secondary | ICD-10-CM

## 2018-12-26 DIAGNOSIS — E039 Hypothyroidism, unspecified: Secondary | ICD-10-CM

## 2018-12-26 DIAGNOSIS — E7800 Pure hypercholesterolemia, unspecified: Secondary | ICD-10-CM

## 2018-12-26 DIAGNOSIS — R110 Nausea: Secondary | ICD-10-CM | POA: Insufficient documentation

## 2018-12-26 LAB — COMPREHENSIVE METABOLIC PANEL
ALT: 14 U/L (ref 0–53)
AST: 16 U/L (ref 0–37)
Albumin: 4 g/dL (ref 3.5–5.2)
Alkaline Phosphatase: 92 U/L (ref 39–117)
BUN: 16 mg/dL (ref 6–23)
CO2: 25 mEq/L (ref 19–32)
Calcium: 8.9 mg/dL (ref 8.4–10.5)
Chloride: 103 mEq/L (ref 96–112)
Creatinine, Ser: 0.94 mg/dL (ref 0.40–1.50)
GFR: 82.51 mL/min (ref >60.00)
Glucose, Bld: 97 mg/dL (ref 70–99)
Potassium: 4.2 mEq/L (ref 3.5–5.1)
Sodium: 137 mEq/L (ref 135–145)
Total Bilirubin: 0.8 mg/dL (ref 0.2–1.2)
Total Protein: 6.8 g/dL (ref 6.0–8.3)

## 2018-12-26 LAB — LIPID PANEL
Cholesterol: 133 mg/dL (ref 0–200)
HDL: 41.5 mg/dL (ref >39.00)
LDL Cholesterol: 78 mg/dL (ref 0–99)
NonHDL: 91.45
Total CHOL/HDL Ratio: 3
Triglycerides: 68 mg/dL (ref 0.0–149.0)
VLDL: 13.6 mg/dL (ref 0.0–40.0)

## 2018-12-26 LAB — TSH: TSH: 0.6 u[IU]/mL (ref 0.35–4.50)

## 2018-12-26 NOTE — Patient Instructions (Signed)
Nice to see you.  We will check lab work. Please monitor your nausea and if you notice anything that is contributing please let us know.   Please keep your appointment with urology.

## 2018-12-26 NOTE — Progress Notes (Signed)
Tommi Rumps, MD Phone: [phone]  Cory [name] is a 58 y.o. male who presents today for CPE.  Exercise, notes he is active during the day.  He does walk some for exercise. Diet is described as regular.  Very little soda or sweet tea.  Occasional junk food. Tetanus vaccine and Shingrix up-to-date. Colonoscopy up-to-date 10/09/2016 with 1 adenoma and 1 hyperplastic polyp.  He notes he had to pay for this out of pocket he is unsure if he will complete another colonoscopy. Prostate cancer is being followed by urology and he just underwent cryoablation.  He has had some urine leakage, urinary frequency, and difficulty urinating since the surgery though he notes this is improving.  He has discussed this with urology. Former tobacco smoker of 1 pack/day for about 30 years.  He is going to reschedule his lung cancer screening CT.  No illicit drug use. He sees a Pharmacist, community twice a year.  He does not see an ophthalmologist.  Nausea: He notes this has been going on for about 2 years intermittently.  Occurs once every 1 to 3 weeks.  He notes no additional symptoms with it.  Notes it lasts very briefly.  No vomiting or abdominal pain.  No chest pain or shortness of breath.  He did have some reflux and some burning in his chest about 6 months ago though that has not recurred.  Active Ambulatory Problems    Diagnosis Date Noted  . Prostate cancer ([place]) 07/12/2016  . Hypothyroidism 07/12/2016  . Insomnia 07/12/2016  . Hyperlipidemia 12/20/2016  . Encounter for general adult medical examination with abnormal findings 12/20/2016  . History of tobacco abuse 06/17/2018  . Internal hemorrhoids 11/12/2018  . Nausea 12/26/2018   Resolved Ambulatory Problems    Diagnosis Date Noted  . Colon cancer screening 07/12/2016   Past Medical History:  Diagnosis Date  . Benign localized prostatic hyperplasia with lower urinary tract symptoms (LUTS)   . COPD (chronic obstructive pulmonary disease) ([place])    . Diverticulosis of colon   . GERD (gastroesophageal reflux disease)   . History of adenomatous polyp of colon   . History of pneumothorax     Family History  Problem Relation Age of Onset  . Lung cancer Mother 44  . Hyperlipidemia Mother   . Hypertension Mother   . Thyroid cancer Sister 10       thyroid/evironmental    Social History   Socioeconomic History  . Marital status: Divorced    Spouse name: Not on file  . Number of children: Not on file  . Years of education: Not on file  . Highest education level: Not on file  Occupational History  . Not on file  Social Needs  . Financial resource strain: Not on file  . Food insecurity:    Worry: Not on file    Inability: Not on file  . Transportation needs:    Medical: Not on file    Non-medical: Not on file  Tobacco Use  . Smoking status: Former Smoker    Packs/day: 1.00    Years: 35.00    Pack years: 35.00    Types: Cigarettes    Last attempt to quit: 07/30/2013    Years since quitting: 5.4  . Smokeless tobacco: Never Used  Substance and Sexual Activity  . Alcohol use: Yes    Alcohol/week: 1.0 standard drinks    Types: 1 Cans of beer per week    Comment: FEW BEERS WEEK; consumed 1 beer on  11/13/2018  . Drug use: No  . Sexual activity: Not on file  Lifestyle  . Physical activity:    Days per week: Not on file    Minutes per session: Not on file  . Stress: Not on file  Relationships  . Social connections:    Talks on phone: Not on file    Gets together: Not on file    Attends religious service: Not on file    Active member of club or organization: Not on file    Attends meetings of clubs or organizations: Not on file    Relationship status: Not on file  . Intimate partner violence:    Fear of current or ex partner: Not on file    Emotionally abused: Not on file    Physically abused: Not on file    Forced sexual activity: Not on file  Other Topics Concern  . Not on file  Social History Narrative  . Not  on file    ROS  General:  Negative for nexplained weight loss, fever Skin: Negative for new or changing mole, sore that won't heal HEENT: Negative for trouble hearing, trouble seeing, ringing in ears, mouth sores, hoarseness, change in voice, dysphagia. CV:  Negative for chest pain, dyspnea, edema, palpitations Resp: Negative for cough, dyspnea, hemoptysis GI: Positive for nausea, negative for vomiting, diarrhea, constipation, abdominal pain, melena, hematochezia. GU: Positive for incontinence, frequent urination, urinary hesitancy, negative for dysuria, hematuria, vaginal or penile discharge, sexual difficulty, lumps in testicle or breasts MSK: Negative for muscle cramps or aches, joint pain or swelling Neuro: Negative for headaches, weakness, numbness, dizziness, passing out/fainting Psych: Negative for depression, anxiety, memory problems  Objective  Physical Exam Vitals:   12/26/18 0818  BP: 122/78  Pulse: 67  Temp: 98.2 F (36.8 C)  SpO2: 98%    BP Readings from Last 3 Encounters:  12/26/18 122/78  11/14/18 130/80  11/11/18 126/71   Wt Readings from Last 3 Encounters:  12/26/18 162 lb 6.4 oz (73.7 kg)  11/11/18 168 lb (76.2 kg)  11/06/18 160 lb (72.6 kg)    Physical Exam Constitutional:      General: He is not in acute distress.    Appearance: He is not diaphoretic.  HENT:     Head: Normocephalic and atraumatic.     Mouth/Throat:     Mouth: Mucous membranes are moist.     Pharynx: Oropharynx is clear.  Eyes:     Conjunctiva/sclera: Conjunctivae normal.     Pupils: Pupils are equal, round, and reactive to light.  Cardiovascular:     Rate and Rhythm: Normal rate and regular rhythm.     Heart sounds: Normal heart sounds.  Pulmonary:     Effort: Pulmonary effort is normal.     Breath sounds: Normal breath sounds.  Abdominal:     General: Bowel sounds are normal. There is no distension.     Palpations: Abdomen is soft. There is no mass.     Tenderness:  There is no abdominal tenderness.  Musculoskeletal:     Right lower leg: No edema.     Left lower leg: No edema.  Skin:    General: Skin is warm and dry.  Neurological:     Mental Status: He is alert.      Assessment/Plan:   Encounter for general adult medical examination with abnormal findings Physical exam completed.  Encouraged remaining active and monitoring his diet.  Vaccines up-to-date.  Colonoscopy up-to-date.  He will continue to  follow with urology for his prostate cancer.  He will contact the cancer center regarding his lung cancer screening CT and getting this rescheduled if he does not hear from them in June.  Lab work as outlined below.  Hypothyroidism Check TSH.  Nausea Only minimal symptoms that occur intermittently.  Potentially could be medication related Flomax and Crestor do have nausea listed as side effects.  Given that this is minimal he will continue to monitor.  If this worsens or develops other symptoms he will let us know.   Orders Placed This Encounter  Procedures  . Lipid panel  . Comp Met (CMET)  . TSH    No orders of the defined types were placed in this encounter.    Tommi Rumps, MD [place]

## 2018-12-26 NOTE — Assessment & Plan Note (Signed)
Physical exam completed.  Encouraged remaining active and monitoring his diet.  Vaccines up-to-date.  Colonoscopy up-to-date.  He will continue to follow with urology for his prostate cancer.  He will contact the cancer center regarding his lung cancer screening CT and getting this rescheduled if he does not hear from them in June.  Lab work as outlined below.

## 2018-12-26 NOTE — Assessment & Plan Note (Signed)
Only minimal symptoms that occur intermittently.  Potentially could be medication related Flomax and Crestor do have nausea listed as side effects.  Given that this is minimal he will continue to monitor.  If this worsens or develops other symptoms he will let us know.

## 2018-12-26 NOTE — Assessment & Plan Note (Signed)
Check TSH 

## 2019-01-01 ENCOUNTER — Telehealth: Payer: Self-pay | Admitting: *Deleted

## 2019-01-01 DIAGNOSIS — Z87891 Personal history of nicotine dependence: Secondary | ICD-10-CM

## 2019-01-01 DIAGNOSIS — Z122 Encounter for screening for malignant neoplasm of respiratory organs: Secondary | ICD-10-CM

## 2019-01-01 NOTE — Telephone Encounter (Signed)
Received referral for initial lung cancer screening scan. Contacted patient and obtained smoking history,(former, quit 2014, 38 pack year) as well as answering questions related to screening process. Patient denies signs of lung cancer such as weight loss or hemoptysis. Patient denies comorbidity that would prevent curative treatment if lung cancer were found. Patient is scheduled for shared decision making visit and CT scan on 01/08/19 at 130pm.

## 2019-01-08 ENCOUNTER — Other Ambulatory Visit: Payer: Self-pay

## 2019-01-08 ENCOUNTER — Encounter: Payer: Self-pay | Admitting: Oncology

## 2019-01-08 ENCOUNTER — Ambulatory Visit
Admission: RE | Admit: 2019-01-08 | Discharge: 2019-01-08 | Disposition: A | Discharge status: Home / Self Care | Payer: 59 | Source: Ambulatory Visit | Attending: Oncology | Admitting: Oncology

## 2019-01-08 ENCOUNTER — Inpatient Hospital Stay: Payer: 59 | Attending: Nurse Practitioner | Admitting: Oncology

## 2019-01-08 DIAGNOSIS — Z87891 Personal history of nicotine dependence: Secondary | ICD-10-CM

## 2019-01-08 DIAGNOSIS — Z122 Encounter for screening for malignant neoplasm of respiratory organs: Secondary | ICD-10-CM | POA: Insufficient documentation

## 2019-01-09 ENCOUNTER — Encounter: Payer: Self-pay | Admitting: *Deleted

## 2019-01-12 NOTE — Progress Notes (Signed)
Virtual Visit via Video Note  I connected with Cory [name] on 01/12/19 at  1:30 PM EDT by a video enabled telemedicine application and verified that I am speaking with the correct person using two identifiers.  Location: Patient: [facility] Provider: Home   I discussed the limitations of evaluation and management by telemedicine and the availability of in person appointments. The patient expressed understanding and agreed to proceed.  I discussed the assessment and treatment plan with the patient. The patient was provided an opportunity to ask questions and all were answered. The patient agreed with the plan and demonstrated an understanding of the instructions.   The patient was advised to call back or seek an in-person evaluation if the symptoms worsen or if the condition fails to improve as anticipated.   In accordance with CMS guidelines, patient has met eligibility criteria including age, absence of signs or symptoms of lung cancer.  Social History   Tobacco Use  . Smoking status: Former Smoker    Packs/day: 1.00    Years: 38.00    Pack years: 38.00    Types: Cigarettes    Quit date: 2014    Years since quitting: 6.4  . Smokeless tobacco: Never Used  Substance Use Topics  . Alcohol use: Yes    Alcohol/week: 1.0 standard drinks    Types: 1 Cans of beer per week    Comment: FEW BEERS WEEK; consumed 1 beer on 11/13/2018  . Drug use: No      A shared decision-making session was conducted prior to the performance of CT scan. This includes one or more decision aids, includes benefits and harms of screening, follow-up diagnostic testing, over-diagnosis, false positive rate, and total radiation exposure.   Counseling on the importance of adherence to annual lung cancer LDCT screening, impact of co-morbidities, and ability or willingness to undergo diagnosis and treatment is imperative for compliance of the program.   Counseling on the importance of continued smoking cessation for former  smokers; the importance of smoking cessation for current smokers, and information about tobacco cessation interventions have been given to patient including [place] and [facility] quit [place] programs.   Written order for lung cancer screening with LDCT has been given to the patient and any and all questions have been answered to the best of my abilities.    Yearly follow up will be coordinated by Burgess Estelle, Thoracic Navigator.  I provided 10 minutes of face-to-face video visit time during this encounter, and > 50% was spent counseling as documented under my assessment & plan.   Jacquelin Hawking, NP

## 2019-02-05 ENCOUNTER — Telehealth: Payer: Self-pay | Admitting: Family Medicine

## 2019-02-05 NOTE — Telephone Encounter (Signed)
Last OV- 12/26/18  Last Refill:  05/09/18

## 2019-02-05 NOTE — Telephone Encounter (Signed)
REFILL famciclovir [facility]) 500 MG tablet  PHARMACY [id number] [address], [place] (Phone) [phone] (Fax)

## 2019-02-06 MED ORDER — FAMCICLOVIR 500 MG PO TABS: 500.0000 mg | ORAL_TABLET | Freq: Two times a day (BID) | ORAL | 1 refills | Status: DC

## 2019-02-06 NOTE — Telephone Encounter (Signed)
Sent to pharmacy 

## 2019-02-06 NOTE — Telephone Encounter (Signed)
Patient aware.

## 2019-03-09 ENCOUNTER — Other Ambulatory Visit: Payer: Self-pay | Admitting: Family Medicine

## 2019-03-12 ENCOUNTER — Other Ambulatory Visit: Payer: Self-pay | Admitting: Family Medicine

## 2019-03-23 ENCOUNTER — Encounter: Payer: Self-pay | Admitting: Family Medicine

## 2019-03-23 DIAGNOSIS — R0681 Apnea, not elsewhere classified: Secondary | ICD-10-CM

## 2019-03-26 ENCOUNTER — Encounter: Payer: Self-pay | Admitting: Family Medicine

## 2019-03-31 ENCOUNTER — Other Ambulatory Visit: Payer: Self-pay | Admitting: Family Medicine

## 2019-04-02 NOTE — Telephone Encounter (Signed)
Please contact patient and see if he is taking Paxil in addition to mirtazapine.  Please confirm the reason he is currently on Paxil and see how often he takes it.  Thanks.

## 2019-04-10 NOTE — Telephone Encounter (Signed)
Please see my prior message and contact the patient.

## 2019-04-16 ENCOUNTER — Telehealth: Payer: Self-pay | Admitting: Family Medicine

## 2019-04-16 DIAGNOSIS — G4733 Obstructive sleep apnea (adult) (pediatric): Secondary | ICD-10-CM

## 2019-04-16 MED ORDER — MIRTAZAPINE 30 MG PO TABS: ORAL_TABLET | ORAL | 1 refills | Status: DC

## 2019-04-16 MED ORDER — LEVOTHYROXINE SODIUM 112 MCG PO TABS: 112.0000 ug | ORAL_TABLET | Freq: Every day | ORAL | 1 refills | Status: DC

## 2019-04-16 NOTE — Telephone Encounter (Signed)
Please call the patient and let him know that his sleep study is concerning for sleep apnea.  They recommended an in lab CPAP study to get him fit for a CPAP.  I would like to place this order if he is willing.  Thanks.

## 2019-04-16 NOTE — Telephone Encounter (Signed)
Order placed.  I will forward to [facility] as well to get this scheduled.  Please advise the patient that if he does not hear about this being scheduled in the next 1 to 2 weeks he should contact us.  Thanks.

## 2019-04-16 NOTE — Telephone Encounter (Signed)
I called and spoke with patient and he stated that he is willing to do the lab CPAP study.  Tejal Monroy,cma

## 2019-04-16 NOTE — Telephone Encounter (Signed)
Called and spoke with patient and asked about the Paxil.  Patient stated he takes the Paxil for premature ejaculation  and he only takes it once or twice weekly.  Cory [name],cma

## 2019-04-16 NOTE — Addendum Note (Signed)
Addended by: Leone Haven on: 04/16/2019 11:32 AM   Modules accepted: Orders

## 2019-04-16 NOTE — Addendum Note (Signed)
Addended by: Caryl Bis, Criselda Starke G on: 04/16/2019 12:11 PM   Modules accepted: Orders

## 2019-04-16 NOTE — Telephone Encounter (Signed)
Refills sent to pharmacy. 

## 2019-05-29 ENCOUNTER — Encounter: Payer: Self-pay | Admitting: Family Medicine

## 2019-06-02 ENCOUNTER — Other Ambulatory Visit: Admission: RE | Admit: 2019-06-02 | Payer: 59 | Source: Ambulatory Visit

## 2019-06-29 ENCOUNTER — Ambulatory Visit (INDEPENDENT_AMBULATORY_CARE_PROVIDER_SITE_OTHER): Payer: 59 | Admitting: Family Medicine

## 2019-06-29 ENCOUNTER — Encounter: Payer: Self-pay | Admitting: Family Medicine

## 2019-06-29 ENCOUNTER — Other Ambulatory Visit: Payer: Self-pay

## 2019-06-29 DIAGNOSIS — E039 Hypothyroidism, unspecified: Secondary | ICD-10-CM

## 2019-06-29 DIAGNOSIS — M25521 Pain in right elbow: Secondary | ICD-10-CM

## 2019-06-29 DIAGNOSIS — G4733 Obstructive sleep apnea (adult) (pediatric): Secondary | ICD-10-CM

## 2019-06-29 DIAGNOSIS — E7800 Pure hypercholesterolemia, unspecified: Secondary | ICD-10-CM | POA: Diagnosis not present

## 2019-06-29 DIAGNOSIS — F5101 Primary insomnia: Secondary | ICD-10-CM

## 2019-06-29 DIAGNOSIS — M25522 Pain in left elbow: Secondary | ICD-10-CM

## 2019-06-29 NOTE — Assessment & Plan Note (Addendum)
Well-controlled on last labs.  Plan to recheck labs in 6 months.  Continue Synthroid.

## 2019-06-29 NOTE — Progress Notes (Signed)
Virtual Visit via video Note  This visit type was conducted due to national recommendations for restrictions regarding the COVID-19 pandemic (e.g. social distancing).  This format is felt to be most appropriate for this patient at this time.  All issues noted in this document were discussed and addressed.  No physical exam was performed (except for noted visual exam findings with Video Visits).   I connected with Cory [name] today at  1:15 PM EST by a video enabled telemedicine application and verified that I am speaking with the correct person using two identifiers. Location patient: work Location provider: work Persons participating in the virtual visit: patient, provider  I discussed the limitations, risks, security and privacy concerns of performing an evaluation and management service by telephone and the availability of in person appointments. I also discussed with the patient that there may be a patient responsible charge related to this service. The patient expressed understanding and agreed to proceed.  Reason for visit: follow-up  HPI: Insomnia: Patient notes this is adequately controlled on Remeron.  It does not make him drowsy the next day.  He falls asleep easily though does wake up at night at times.  If he does not take the Remeron he will wake up at 2 AM.  He gets 6 to 7 hours of sleep nightly.  OSA: Patient had OSA on home sleep study.  He was referred for CPAP titration though his insurance requested that he have a home sleep study even though he already had 1.  He has not been contacted regarding this.  Bilateral elbow pain: Patient notes pain in his left posterior elbow and his right anterior elbow in the antecubital fossa.  This has been going on for about a month.  Typically only bothers him if he is lifting things.  No injury.  No change in his activities.  No tenderness of either area.  Hyperlipidemia: Taking Crestor.  No chest pain, right upper quadrant pain, or  myalgias.  No claudication.  Hypothyroidism: Taking Synthroid.  No skin changes.  No heat or cold intolerance.   ROS: See pertinent positives and negatives per HPI.  Past Medical History:  Diagnosis Date  . Benign localized prostatic hyperplasia with lower urinary tract symptoms (LUTS)   . COPD (chronic obstructive pulmonary disease) (HCC)    MILD NO INHALER USED  . Diverticulosis of colon   . GERD (gastroesophageal reflux disease)   . History of adenomatous polyp of colon    2013  . History of pneumothorax    1989--  s/p right thoractomy for bleb HAD TOTAL OF 3 TIMES , RIGHT LUNG X 2 LEFT X 1  . Hypothyroidism   . Insomnia   . Prostate cancer [facility]) dx 02-26-2016 via bx--  urologist-  dr Tresa Moore  oncologist-  dr Tammi Klippel   Stage T2a, Gleason 7,  PSA 5.56,  vol 44cc    Past Surgical History:  Procedure Laterality Date  . COLONOSCOPY  last one 10-09-2016  . CRYOABLATION N/A 11/14/2018   Procedure: CRYO ABLATION PROSTATE;  Surgeon: Cleon Gustin, MD;  Location: [facility];  Service: Urology;  Laterality: N/A;  90 MINS  . CYSTOSCOPY N/A 10/19/2016   Procedure: CYSTOSCOPY FLEXIBLE;  Surgeon: Alexis Frock, MD;  Location: [facility];  Service: Urology;  Laterality: N/A;  no seeds found in bladder  . PROSTATE BIOPSY  01/2016 AND JAN 2020  . RADIOACTIVE SEED IMPLANT N/A 10/19/2016   Procedure: RADIOACTIVE SEED IMPLANT/BRACHYTHERAPY IMPLANT SPACEOAR PLACEMENT;  Surgeon: Alexis Frock, MD;  Location: [facility];  Service: Urology;  Laterality: N/A;   seeds implanted--77   . THORACOTOMY Right 1989   Bleb / pneumothorax     Family History  Problem Relation Age of Onset  . Lung cancer Mother 51  . Hyperlipidemia Mother   . Hypertension Mother   . Thyroid cancer Sister 27       thyroid/evironmental    SOCIAL HX: Former smoker.   Current Outpatient Medications:  .  famciclovir (FAMVIR) 500 MG tablet, Take 1 tablet (500 mg total) by mouth 2 (two)  times daily. For 7 days at first sign of cold sore., Disp: 14 tablet, Rfl: 1 .  HYDROcodone-acetaminophen (NORCO) 5-325 MG tablet, Take 1 tablet by mouth every 4 (four) hours as needed for moderate pain., Disp: 30 tablet, Rfl: 0 .  hydrocortisone-pramoxine (ANALPRAM-HC) 2.5-1 % rectal cream, Apply a small amount to the external anal tissue three times a day if needed for comfort., Disp: 30 g, Rfl: 1 .  levothyroxine (SYNTHROID) 112 MCG tablet, Take 1 tablet (112 mcg total) by mouth daily., Disp: 90 tablet, Rfl: 1 .  mirtazapine (REMERON) 30 MG tablet, TAKE 1 TABLET BY MOUTH EVERYDAY AT BEDTIME, Disp: 90 tablet, Rfl: 1 .  PARoxetine (PAXIL) 20 MG tablet, Take 20 mg by mouth daily as needed (ED)., Disp: , Rfl:  .  rosuvastatin (CRESTOR) 40 MG tablet, Take 1 tablet (40 mg total) by mouth daily., Disp: 90 tablet, Rfl: 3  EXAM:  VITALS per patient if applicable: None.  GENERAL: alert, oriented, appears well and in no acute distress  HEENT: atraumatic, conjunttiva clear, no obvious abnormalities on inspection of external nose and ears  NECK: normal movements of the head and neck  LUNGS: on inspection no signs of respiratory distress, breathing rate appears normal, no obvious gross SOB, gasping or wheezing  CV: no obvious cyanosis  MS: moves all visible extremities without noticeable abnormality  PSYCH/NEURO: pleasant and cooperative, no obvious depression or anxiety, speech and thought processing grossly intact  ASSESSMENT AND PLAN:  Discussed the following assessment and plan:  Hypothyroidism Well-controlled on last labs.  Plan to recheck labs in 6 months.  Continue Synthroid.  Insomnia Adequately controlled on Remeron.  We will continue this medication.  Hyperlipidemia Well-controlled on Crestor.  We will continue this medication.  Plan for labs in 6 months.  Bilateral elbow joint pain Patient with potential bursitis in the left elbow and muscle strain in the right elbow.   Discussed trial of over-the-counter ibuprofen or Aleve to be taken with food.  He can trial this for 4 to 5 days and if not improving he will let us know.  Discussed icing for 10 minutes 2-3 times daily.  OSA (obstructive sleep apnea) Patient with sleep apnea based on home sleep study.  We will follow up on his titration study and if he is unable to have this done we will try to get him set up with an AutoPap.    I discussed the assessment and treatment plan with the patient. The patient was provided an opportunity to ask questions and all were answered. The patient agreed with the plan and demonstrated an understanding of the instructions.   The patient was advised to call back or seek an in-person evaluation if the symptoms worsen or if the condition fails to improve as anticipated.    Tommi Rumps, MD

## 2019-06-29 NOTE — Assessment & Plan Note (Signed)
Well-controlled on Crestor.  We will continue this medication.  Plan for labs in 6 months.

## 2019-06-29 NOTE — Assessment & Plan Note (Signed)
Adequately controlled on Remeron.  We will continue this medication.

## 2019-06-29 NOTE — Assessment & Plan Note (Signed)
Patient with sleep apnea based on home sleep study.  We will follow up on his titration study and if he is unable to have this done we will try to get him set up with an AutoPap.

## 2019-06-29 NOTE — Assessment & Plan Note (Signed)
Patient with potential bursitis in the left elbow and muscle strain in the right elbow.  Discussed trial of over-the-counter ibuprofen or Aleve to be taken with food.  He can trial this for 4 to 5 days and if not improving he will let us know.  Discussed icing for 10 minutes 2-3 times daily.

## 2019-06-30 ENCOUNTER — Telehealth: Payer: Self-pay | Admitting: Family Medicine

## 2019-06-30 DIAGNOSIS — G4733 Obstructive sleep apnea (adult) (pediatric): Secondary | ICD-10-CM

## 2019-06-30 NOTE — Telephone Encounter (Signed)
Spoke to [employer]. She states that you can order an auto pap machine through DME and order it with a 5-20 setting and it will only give the patient the amount of pressure needed. The auto pap machine will be used for 30 days only and you can get the download from the machine and it will give you the titration that is needed or the patient can just continue to use the auto titration if the patient is doing good with it.**on the DME order you can ask them to send the download to you after the 30 days so you will not have to remember to order it.

## 2019-07-01 ENCOUNTER — Encounter: Payer: Self-pay | Admitting: Family Medicine

## 2019-07-02 ENCOUNTER — Other Ambulatory Visit: Payer: Self-pay

## 2019-07-02 MED ORDER — MIRTAZAPINE 30 MG PO TABS: ORAL_TABLET | ORAL | 1 refills | Status: DC

## 2019-07-02 NOTE — Telephone Encounter (Signed)
Order has been placed and printed. Can you get this off the printer and place in my sign basket? Thanks.

## 2019-07-03 NOTE — Telephone Encounter (Signed)
Placed in signed basket. Nina,cma

## 2019-08-29 ENCOUNTER — Other Ambulatory Visit: Payer: Self-pay | Admitting: Family Medicine

## 2019-09-02 ENCOUNTER — Other Ambulatory Visit: Payer: Self-pay

## 2019-09-28 ENCOUNTER — Telehealth: Payer: Self-pay | Admitting: Family Medicine

## 2019-09-28 NOTE — Telephone Encounter (Signed)
FYI. Pt called to schedule an in office appt he is having pain in his throat and is fatigue told him I would have to set up  Doxy appt and he hung up on me

## 2019-10-28 ENCOUNTER — Other Ambulatory Visit: Payer: Self-pay | Admitting: Family Medicine

## 2020-01-06 ENCOUNTER — Telehealth: Payer: Self-pay

## 2020-01-06 DIAGNOSIS — Z122 Encounter for screening for malignant neoplasm of respiratory organs: Secondary | ICD-10-CM

## 2020-01-06 DIAGNOSIS — Z87891 Personal history of nicotine dependence: Secondary | ICD-10-CM

## 2020-01-06 NOTE — Telephone Encounter (Signed)
Message left notifying patient that it is time to schedule the low dose lung cancer screening CT scan.  Instructed patient to return call to Shawn Perkins at [phone] to verify information prior to CT scan being scheduled.    

## 2020-01-07 NOTE — Addendum Note (Signed)
Addended by: Lieutenant Diego on: 01/07/2020 10:31 AM   Modules accepted: Orders

## 2020-01-07 NOTE — Telephone Encounter (Signed)
Patient has been notified that annual lung cancer screening low dose CT scan is due currently or will be in near future. Confirmed that patient is within the age range of 55-77, and asymptomatic, (no signs or symptoms of lung cancer). Patient denies illness that would prevent curative treatment for lung cancer if found. Verified smoking history, (former, quit 2014, 38 pack year). The shared decision making visit was done 01/08/19. Patient is agreeable for CT scan being scheduled.

## 2020-01-21 ENCOUNTER — Ambulatory Visit
Admission: RE | Admit: 2020-01-21 | Discharge: 2020-01-21 | Disposition: A | Discharge status: Home / Self Care | Payer: 59 | Source: Ambulatory Visit | Attending: Oncology | Admitting: Oncology

## 2020-01-21 ENCOUNTER — Other Ambulatory Visit: Payer: Self-pay

## 2020-01-21 DIAGNOSIS — Z122 Encounter for screening for malignant neoplasm of respiratory organs: Secondary | ICD-10-CM | POA: Diagnosis present

## 2020-01-21 DIAGNOSIS — Z87891 Personal history of nicotine dependence: Secondary | ICD-10-CM | POA: Diagnosis present

## 2020-01-27 ENCOUNTER — Encounter: Payer: Self-pay | Admitting: *Deleted

## 2020-03-31 ENCOUNTER — Other Ambulatory Visit: Payer: Self-pay | Admitting: Family Medicine

## 2020-05-03 ENCOUNTER — Encounter: Payer: 59 | Admitting: Family Medicine

## 2020-05-06 ENCOUNTER — Other Ambulatory Visit: Payer: Self-pay

## 2020-05-06 ENCOUNTER — Encounter: Payer: Self-pay | Admitting: Family Medicine

## 2020-05-06 ENCOUNTER — Telehealth: Payer: Self-pay | Admitting: Family Medicine

## 2020-05-06 ENCOUNTER — Ambulatory Visit (INDEPENDENT_AMBULATORY_CARE_PROVIDER_SITE_OTHER): Payer: 59 | Admitting: Family Medicine

## 2020-05-06 VITALS — BP 118/79 | HR 87 | Temp 98.2°F | Ht 67.0 in | Wt 162.8 lb

## 2020-05-06 DIAGNOSIS — Z0000 Encounter for general adult medical examination without abnormal findings: Secondary | ICD-10-CM

## 2020-05-06 DIAGNOSIS — E663 Overweight: Secondary | ICD-10-CM | POA: Diagnosis not present

## 2020-05-06 DIAGNOSIS — E7800 Pure hypercholesterolemia, unspecified: Secondary | ICD-10-CM | POA: Diagnosis not present

## 2020-05-06 DIAGNOSIS — G4733 Obstructive sleep apnea (adult) (pediatric): Secondary | ICD-10-CM

## 2020-05-06 DIAGNOSIS — E039 Hypothyroidism, unspecified: Secondary | ICD-10-CM

## 2020-05-06 MED ORDER — FAMCICLOVIR 500 MG PO TABS: 500.0000 mg | ORAL_TABLET | Freq: Two times a day (BID) | ORAL | 1 refills | Status: DC

## 2020-05-06 NOTE — Patient Instructions (Signed)
Nice to see you. Please decrease your soda intake. We will see about getting you a CPAP. We will get labs today and contact you with the results.

## 2020-05-06 NOTE — Progress Notes (Signed)
Cory Rumps, MD Phone: [phone]  Cory [name] is a 59 y.o. male who presents today for cpe.  Diet: Generally healthy with lean meats and fruits and vegetables though does drink a 12 pack of soda per week. Exercise: Job is very active Colonoscopy: 10/09/2016 with 5-year recall for a tubular adenoma.  He has declined further colonoscopies given cost concerns.  Discussed fecal occult blood testing in the future. Prostate cancer screening: Followed by urology.  Patient has a personal history of prostate cancer. Family history-  Prostate cancer: Brother  Colon cancer: No Vaccines-   Flu: deferred  Tetanus: UTD  Shingles: UTD  COVID19: UTD HIV screening: UTD Hep C Screening: UTD Tobacco use: former smoker Alcohol use: 3 beers/week Illicit Drug use: no Dentist: yes Ophthalmology: yes   Active Ambulatory Problems    Diagnosis Date Noted  . Prostate cancer ([place]) 07/12/2016  . Hypothyroidism 07/12/2016  . Insomnia 07/12/2016  . Hyperlipidemia 12/20/2016  . Routine general medical examination at a health care facility 12/20/2016  . History of tobacco abuse 06/17/2018  . Internal hemorrhoids 11/12/2018  . Nausea 12/26/2018  . Bilateral elbow joint pain 06/29/2019  . OSA (obstructive sleep apnea) 06/29/2019   Resolved Ambulatory Problems    Diagnosis Date Noted  . Colon cancer screening 07/12/2016   Past Medical History:  Diagnosis Date  . Benign localized prostatic hyperplasia with lower urinary tract symptoms (LUTS)   . COPD (chronic obstructive pulmonary disease) ([place])   . Diverticulosis of colon   . GERD (gastroesophageal reflux disease)   . History of adenomatous polyp of colon   . History of pneumothorax     Family History  Problem Relation Age of Onset  . Lung cancer Mother 51  . Hyperlipidemia Mother   . Hypertension Mother   . Thyroid cancer Sister 42       thyroid/evironmental    Social History   Socioeconomic History  . Marital status:  Divorced    Spouse name: Not on file  . Number of children: Not on file  . Years of education: Not on file  . Highest education level: Not on file  Occupational History  . Not on file  Tobacco Use  . Smoking status: Former Smoker    Packs/day: 1.00    Years: 38.00    Pack years: 38.00    Types: Cigarettes    Quit date: 2014    Years since quitting: 7.7  . Smokeless tobacco: Never Used  Vaping Use  . Vaping Use: Never used  Substance and Sexual Activity  . Alcohol use: Yes    Alcohol/week: 1.0 standard drink    Types: 1 Cans of beer per week    Comment: FEW BEERS WEEK; consumed 1 beer on 11/13/2018  . Drug use: No  . Sexual activity: Not on file  Other Topics Concern  . Not on file  Social History Narrative  . Not on file   Social Determinants of Health   Financial Resource Strain:   . Difficulty of Paying Living Expenses: Not on file  Food Insecurity:   . Worried About Charity fundraiser in the Last Year: Not on file  . Ran Out of Food in the Last Year: Not on file  Transportation Needs:   . Lack of Transportation (Medical): Not on file  . Lack of Transportation (Non-Medical): Not on file  Physical Activity:   . Days of Exercise per Week: Not on file  . Minutes of Exercise per Session: Not  on file  Stress:   . Feeling of Stress : Not on file  Social Connections:   . Frequency of Communication with Friends and Family: Not on file  . Frequency of Social Gatherings with Friends and Family: Not on file  . Attends Religious Services: Not on file  . Active Member of Clubs or Organizations: Not on file  . Attends Archivist Meetings: Not on file  . Marital Status: Not on file  Intimate Partner Violence:   . Fear of Current or Ex-Partner: Not on file  . Emotionally Abused: Not on file  . Physically Abused: Not on file  . Sexually Abused: Not on file    ROS  General:  Negative for nexplained weight loss, fever Skin: Negative for new or changing mole,  sore that won't heal HEENT: Negative for trouble hearing, trouble seeing, ringing in ears, mouth sores, hoarseness, change in voice, dysphagia. CV:  Negative for chest pain, dyspnea, edema, palpitations Resp: Negative for cough, dyspnea, hemoptysis GI: Negative for nausea, vomiting, diarrhea, constipation, abdominal pain, melena, hematochezia. GU: Negative for dysuria, incontinence, urinary hesitance, hematuria, vaginal or penile discharge, polyuria, sexual difficulty, lumps in testicle or breasts MSK: Negative for muscle cramps or aches, joint pain or swelling Neuro: Negative for headaches, weakness, numbness, dizziness, passing out/fainting Psych: Negative for depression, anxiety, memory problems  Objective  Physical Exam Vitals:   05/06/20 1545  BP: 118/79  Pulse: 87  Temp: 98.2 F (36.8 C)  SpO2: 96%    BP Readings from Last 3 Encounters:  05/06/20 118/79  12/26/18 122/78  11/14/18 130/80   Wt Readings from Last 3 Encounters:  05/06/20 162 lb 12.8 oz (73.8 kg)  01/21/20 160 lb (72.6 kg)  06/29/19 160 lb (72.6 kg)    Physical Exam Constitutional:      General: He is not in acute distress.    Appearance: He is not diaphoretic.  HENT:     Head: Normocephalic and atraumatic.  Eyes:     Conjunctiva/sclera: Conjunctivae normal.     Pupils: Pupils are equal, round, and reactive to light.  Cardiovascular:     Rate and Rhythm: Normal rate and regular rhythm.     Heart sounds: Normal heart sounds.  Pulmonary:     Effort: Pulmonary effort is normal.     Breath sounds: Normal breath sounds.  Abdominal:     General: Bowel sounds are normal. There is no distension.     Palpations: Abdomen is soft.     Tenderness: There is no abdominal tenderness. There is no guarding or rebound.  Musculoskeletal:     Right lower leg: No edema.     Left lower leg: No edema.  Lymphadenopathy:     Cervical: No cervical adenopathy.  Skin:    General: Skin is warm and dry.  Neurological:       Mental Status: He is alert.  Psychiatric:        Mood and Affect: Mood normal.      Assessment/Plan:   Problem List Items Addressed This Visit    Hyperlipidemia   Relevant Orders   Lipid panel   Comp Met (CMET)   Hypothyroidism   Relevant Orders   TSH   OSA (obstructive sleep apnea)    Patient notes he was never able to get set up with a CPAP or in lab sleep study.  He would be fine with an AutoPap.  We will see about getting this ordered for him.  Phone message sent to  referral coordinator to see about ordering this.      Routine general medical examination at a health care facility - Primary    Physical exam completed.  Discussed decreasing soda intake.  Encouraged activity.  We will continue to see urology for follow-up on his prostate cancer.  Discussed fecal occult blood testing in the future when he is due for colon cancer screening.  Encouraged him to get his flu vaccine when he is ready to.  Other vaccines up-to-date.  Lab work as ordered.       Other Visit Diagnoses    Overweight       Relevant Orders   HgB A1c      This visit occurred during the SARS-CoV-2 public health emergency.  Safety protocols were in place, including screening questions prior to the visit, additional usage of staff PPE, and extensive cleaning of exam room while observing appropriate contact time as indicated for disinfecting solutions.    Cory Rumps, MD [place]

## 2020-05-06 NOTE — Assessment & Plan Note (Addendum)
Patient notes he was never able to get set up with a CPAP or in lab sleep study.  He would be fine with an AutoPap.  We will see about getting this ordered for him.  Phone message sent to referral coordinator to see about ordering this.

## 2020-05-06 NOTE — Telephone Encounter (Signed)
This patient needs to get set up with an auto adjusting CPAP.  Do you have the forms for [facility] to order this?

## 2020-05-06 NOTE — Assessment & Plan Note (Signed)
Physical exam completed.  Discussed decreasing soda intake.  Encouraged activity.  We will continue to see urology for follow-up on his prostate cancer.  Discussed fecal occult blood testing in the future when he is due for colon cancer screening.  Encouraged him to get his flu vaccine when he is ready to.  Other vaccines up-to-date.  Lab work as ordered.

## 2020-05-06 NOTE — Telephone Encounter (Signed)
Yes

## 2020-05-07 LAB — HEMOGLOBIN A1C
Hgb A1c MFr Bld: 5.4 % of total Hgb (ref <5.7)
Mean Plasma Glucose: 108 (calc)
eAG (mmol/L): 6 (calc)

## 2020-05-07 LAB — COMPREHENSIVE METABOLIC PANEL
AG Ratio: 1.9 (calc) (ref 1.0–2.5)
ALT: 24 U/L (ref 9–46)
AST: 24 U/L (ref 10–35)
Albumin: 4.4 g/dL (ref 3.6–5.1)
Alkaline phosphatase (APISO): 80 U/L (ref 35–144)
BUN: 11 mg/dL (ref 7–25)
CO2: 28 mmol/L (ref 20–32)
Calcium: 9.4 mg/dL (ref 8.6–10.3)
Chloride: 104 mmol/L (ref 98–110)
Creat: 1.17 mg/dL (ref 0.70–1.33)
Globulin: 2.3 g/dL (calc) (ref 1.9–3.7)
Glucose, Bld: 88 mg/dL (ref 65–99)
Potassium: 4 mmol/L (ref 3.5–5.3)
Sodium: 139 mmol/L (ref 135–146)
Total Bilirubin: 0.9 mg/dL (ref 0.2–1.2)
Total Protein: 6.7 g/dL (ref 6.1–8.1)

## 2020-05-07 LAB — LIPID PANEL
Cholesterol: 154 mg/dL (ref <200)
HDL: 49 mg/dL (ref >=40)
LDL Cholesterol (Calc): 76 mg/dL (calc)
Non-HDL Cholesterol (Calc): 105 mg/dL (calc) (ref <130)
Total CHOL/HDL Ratio: 3.1 (calc) (ref <5.0)
Triglycerides: 193 mg/dL — ABNORMAL HIGH (ref <150)

## 2020-05-07 LAB — TSH: TSH: 1.18 mIU/L (ref 0.40–4.50)

## 2020-05-09 NOTE — Telephone Encounter (Signed)
Signed. Sleep study is under media tab.

## 2020-05-09 NOTE — Telephone Encounter (Signed)
Ok. Thanks!

## 2020-05-12 NOTE — Telephone Encounter (Signed)
[facility] order and ofc notes was faxed on 05/12/2020.

## 2020-05-25 NOTE — Telephone Encounter (Signed)
[facility] called and said they need the settings placed on the order and resubmitted to them.

## 2020-05-25 NOTE — Telephone Encounter (Signed)
The order was relaxed to [facility] with settings today. Confirmation given.  Erlin Gardella,cma

## 2020-05-27 ENCOUNTER — Other Ambulatory Visit: Payer: Self-pay | Admitting: Family Medicine

## 2020-06-27 IMAGING — NM NM BONE WHOLE BODY
2 series · 2 of 2 positions shown · non-contrast
Comparison: None.

CLINICAL DATA: History of prostate malignancy.

EXAM:
NUCLEAR MEDICINE WHOLE BODY BONE SCAN
TECHNIQUE: Whole body anterior and posterior images were obtained approximately
3 hours after intravenous injection of radiopharmaceutical.
RADIOPHARMACEUTICALS:  21.2 mCi Lechnetium-66m MDP IV

[Series 1: whole body · 2.66mm/px · 1 of 1 slices shown (1 of 2)]
[im 1/1]
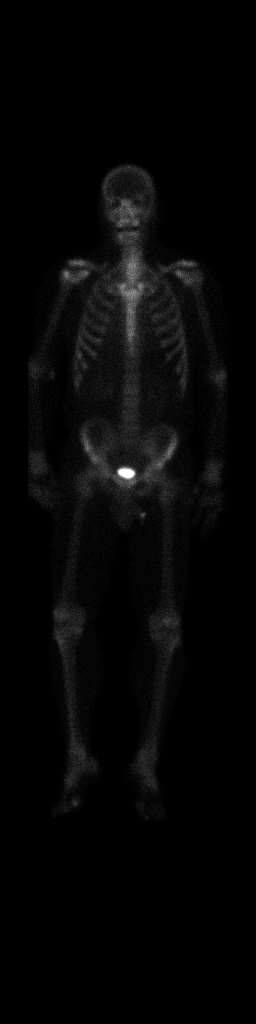

[Series 1: whole body · 2.66mm/px · 1 of 1 slices shown (2 of 2)]
[im 1/1]
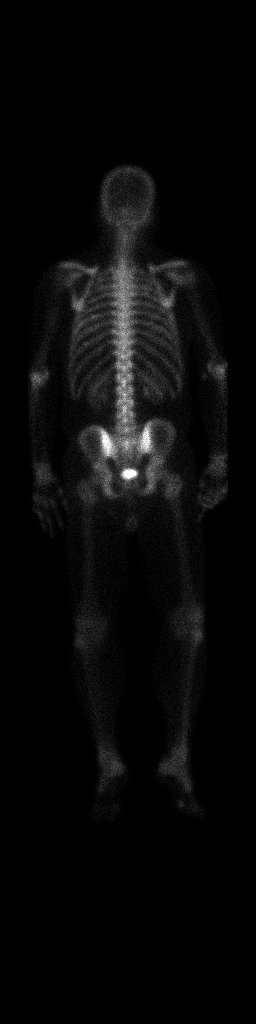

[2 of 2 positions shown; findings below may reference images not displayed]

FINDINGS: There is adequate uptake of the radiopharmaceutical by the skeleton.
There is adequate soft tissue clearance and renal activity.

Uptake over the calvarium, spine, and pectoral girdle is normal.
There is faintly increased uptake in the posterior aspect of the
left sixth and eighth ribs. Uptake over the pelvis is within the
limits of normal. Uptake within the lower extremities does not
suggest metastatic disease. Degenerative type localization is noted
about the knees and in the first MTP joint of the right foot.
IMPRESSION: Minimally increased uptake in the posterior aspect of the left sixth
and eighth ribs may reflect metastatic disease. No definite evidence
of metastatic disease elsewhere. Plain radiographs of the left ribs
would be useful.

## 2020-07-05 IMAGING — DX DG RIBS 2V*L*
3 series · 3 of 3 positions shown · non-contrast
Comparison: 07/28/2018

CLINICAL DATA: Increased uptake in the left ribs on recent bone
scan. History of prostate.

EXAM:
LEFT RIBS - 2 VIEW

[rib pa]
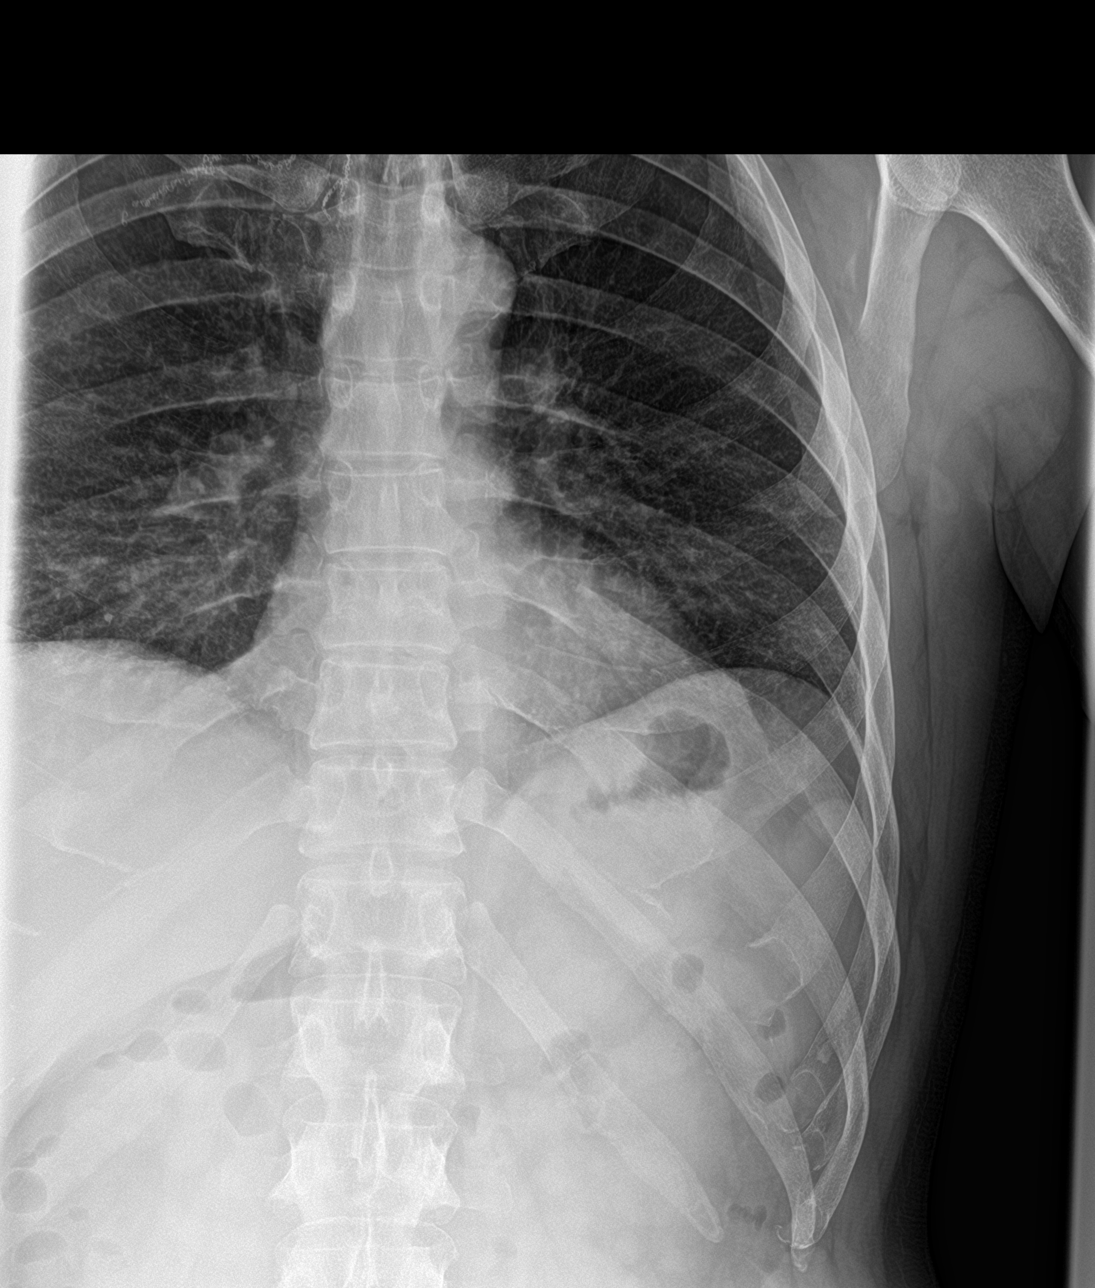

[rib pa obl (1 of 2)]
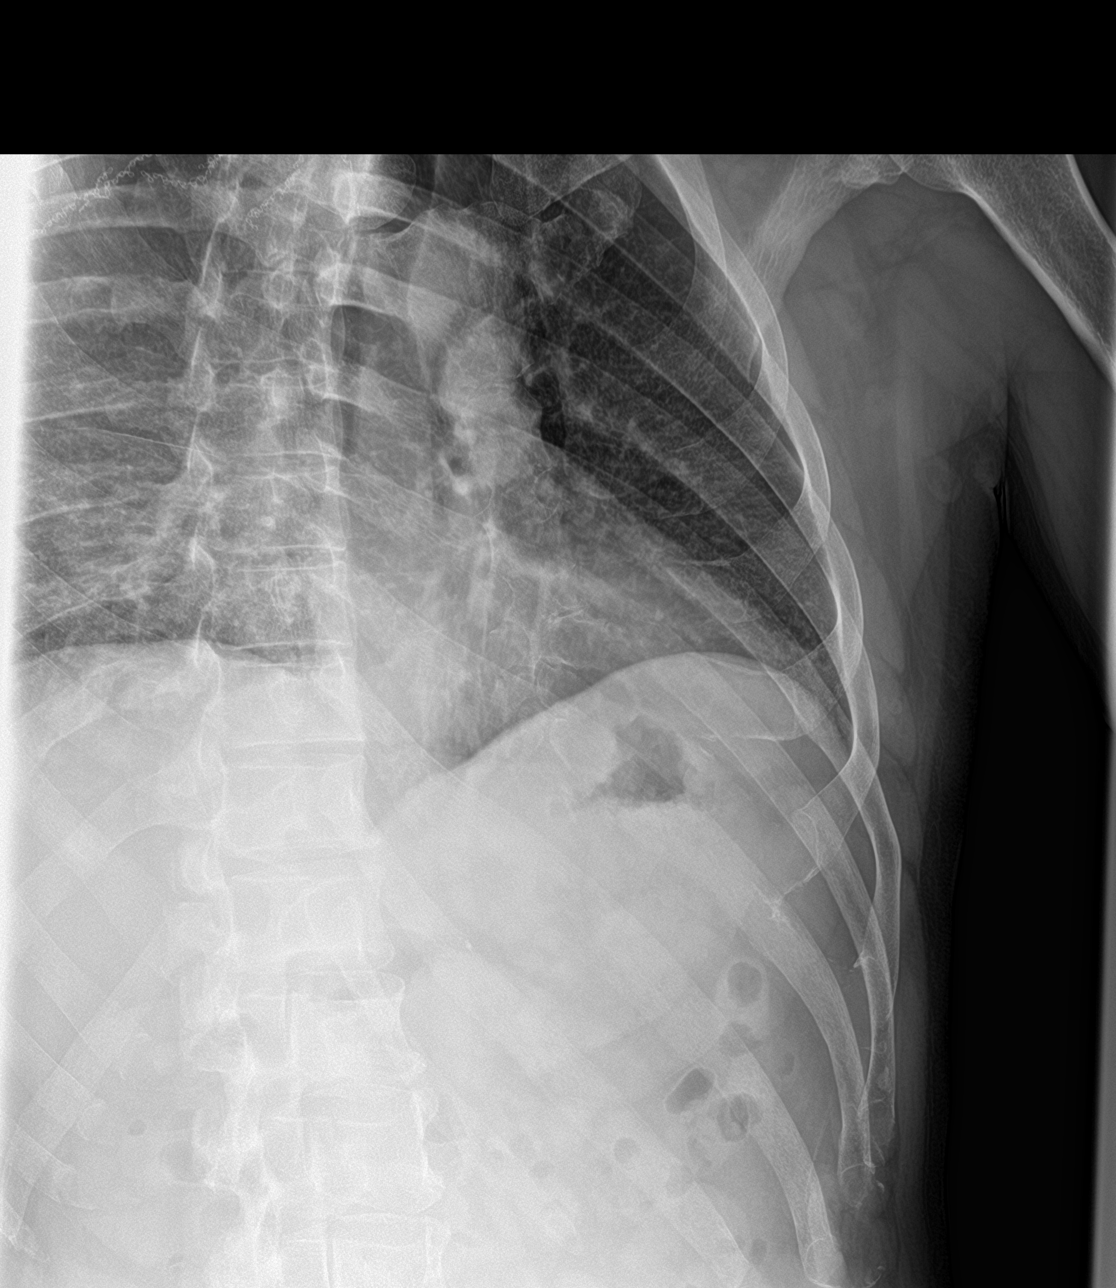

[rib pa obl (2 of 2)]
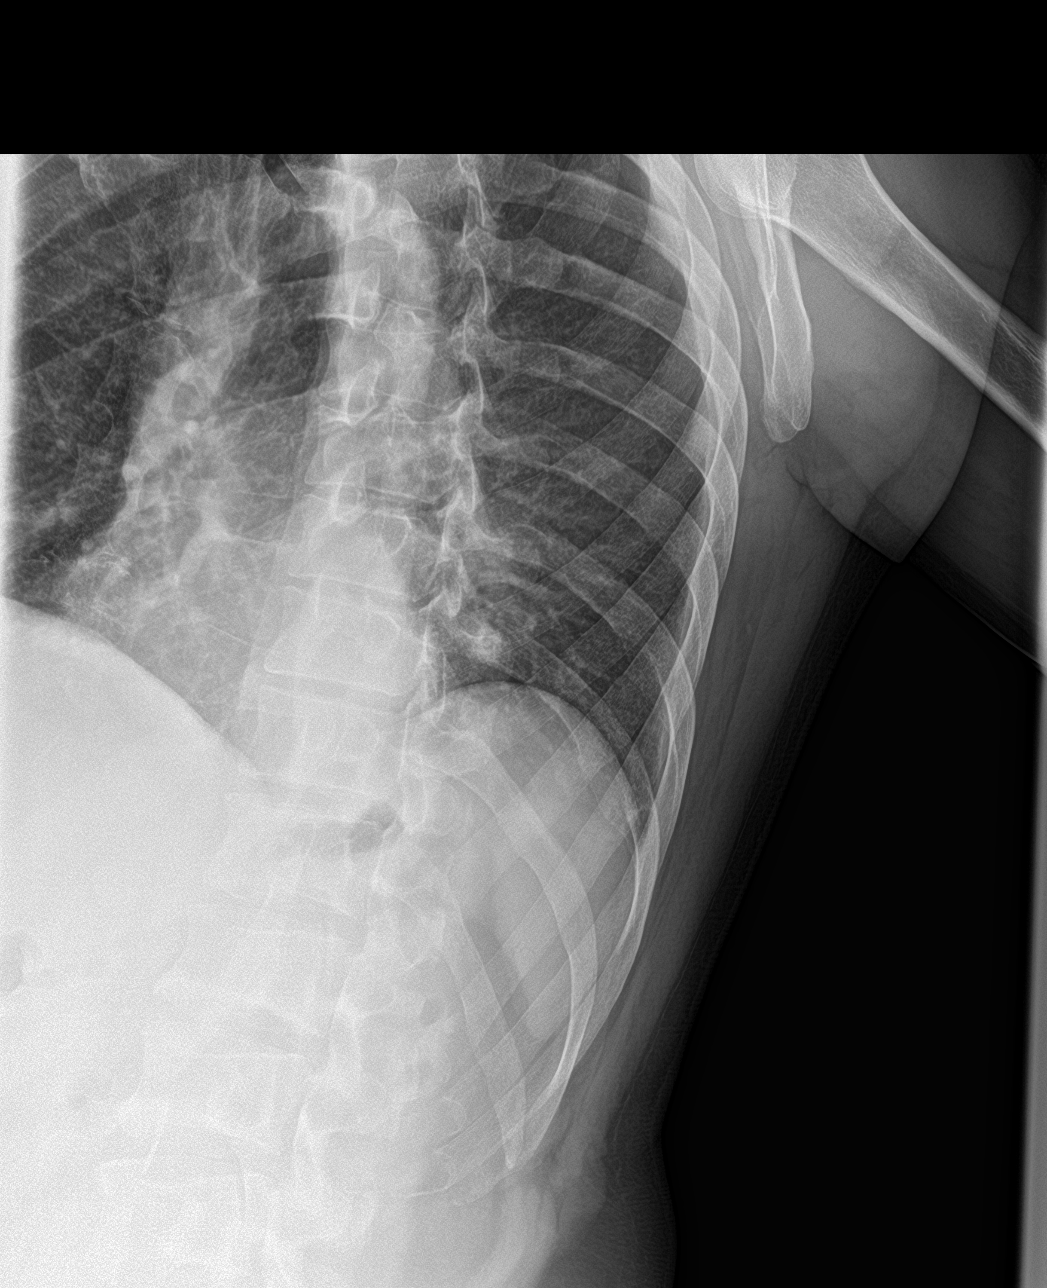

[3 of 3 positions shown; findings below may reference images not displayed]

FINDINGS: No fracture nor aggressive osseous lesions. No osteoblastic disease
is apparent radiographically. No findings to explain the patient's
slight increase in tracer activity in the posterior left sixth rib
on recent bone scan.
IMPRESSION: Negative.

## 2020-07-10 ENCOUNTER — Other Ambulatory Visit: Payer: Self-pay | Admitting: Family Medicine

## 2020-08-04 ENCOUNTER — Other Ambulatory Visit: Payer: Self-pay

## 2020-08-05 DIAGNOSIS — G4733 Obstructive sleep apnea (adult) (pediatric): Secondary | ICD-10-CM | POA: Diagnosis not present

## 2020-08-08 ENCOUNTER — Ambulatory Visit: Payer: BC Managed Care – PPO | Admitting: Family Medicine

## 2020-08-08 ENCOUNTER — Encounter: Payer: Self-pay | Admitting: Family Medicine

## 2020-08-08 ENCOUNTER — Other Ambulatory Visit: Payer: Self-pay

## 2020-08-08 DIAGNOSIS — M7712 Lateral epicondylitis, left elbow: Secondary | ICD-10-CM | POA: Diagnosis not present

## 2020-08-08 DIAGNOSIS — E039 Hypothyroidism, unspecified: Secondary | ICD-10-CM | POA: Diagnosis not present

## 2020-08-08 DIAGNOSIS — G4733 Obstructive sleep apnea (adult) (pediatric): Secondary | ICD-10-CM

## 2020-08-08 DIAGNOSIS — E7800 Pure hypercholesterolemia, unspecified: Secondary | ICD-10-CM

## 2020-08-08 NOTE — Progress Notes (Signed)
Cory Rumps, MD Phone: [phone]  Daril Warga Vore is a 60 y.o. male who presents today for f/u.  OSA CPAP use: almost nightly for at least 4 hours Hypersomnia: no Well rested: yes CPAP company: [employer]  HYPOTHYROIDISM Disease Monitoring Weight changes: no  Skin Changes: no Heat/Cold intolerance: no  Medication Monitoring Compliance:  Taking synthroid   Last TSH:   Lab Results  Component Value Date   TSH 1.18 05/06/2020   HYPERLIPIDEMIA Symptoms Chest pain on exertion:  no   Leg claudication:   no Medications: Compliance- taking crestor Right upper quadrant pain- no  Muscle aches- no  Left elbow pain: Patient notes this has been an ongoing issue.  He notes it hurts around his lateral epicondyles.  Hurts more when he picks something up or when he rotates his arm.  No injury.  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  Social History   Tobacco Use  Smoking Status Former Smoker  . Packs/day: 1.00  . Years: 38.00  . Pack years: 38.00  . Types: Cigarettes  . Quit date: 2014  . Years since quitting: 8.0  Smokeless Tobacco Never Used    Current Outpatient Medications on File Prior to Visit  Medication Sig Dispense Refill  . famciclovir (FAMVIR) 500 MG tablet Take 1 tablet (500 mg total) by mouth 2 (two) times daily. For 7 days at first sign of cold sore. 14 tablet 1  . HYDROcodone-acetaminophen (NORCO) 5-325 MG tablet Take 1 tablet by mouth every 4 (four) hours as needed for moderate pain. 30 tablet 0  . hydrocortisone-pramoxine (ANALPRAM-HC) 2.5-1 % rectal cream Apply a small amount to the external anal tissue three times a day if needed for comfort. 30 g 1  . levothyroxine (SYNTHROID) 112 MCG tablet TAKE 1 TABLET BY MOUTH EVERY DAY 90 tablet 1  . mirtazapine (REMERON) 30 MG tablet TAKE 1 TABLET BY MOUTH EVERYDAY AT BEDTIME 90 tablet 1  . PARoxetine (PAXIL) 20 MG tablet Take 20 mg by mouth daily as needed (ED).    . rosuvastatin (CRESTOR) 40 MG tablet TAKE  1 TABLET BY MOUTH EVERY DAY 90 tablet 3  . tadalafil (CIALIS) 20 MG tablet Take 20 mg by mouth daily.    . tamsulosin (FLOMAX) 0.4 MG CAPS capsule Take 0.4 mg by mouth daily.     No current facility-administered medications on file prior to visit.     ROS see history of present illness  Objective  Physical Exam Vitals:   08/08/20 1341  BP: 130/70  Pulse: 93  Temp: 98.4 F (36.9 C)  SpO2: 96%    BP Readings from Last 3 Encounters:  08/08/20 130/70  05/06/20 118/79  12/26/18 122/78   Wt Readings from Last 3 Encounters:  08/08/20 169 lb 3.2 oz (76.7 kg)  05/06/20 162 lb 12.8 oz (73.8 kg)  01/21/20 160 lb (72.6 kg)    Physical Exam Constitutional:      General: He is not in acute distress.    Appearance: He is not diaphoretic.  Cardiovascular:     Rate and Rhythm: Normal rate and regular rhythm.     Heart sounds: Normal heart sounds.  Pulmonary:     Effort: Pulmonary effort is normal.     Breath sounds: Normal breath sounds.  Musculoskeletal:        General: No edema.     Comments: Very mild tenderness around the left elbow lateral epicondyle, slight discomfort on resisted pronation supination, no swelling, no warmth, no erythema  Skin:    General: Skin is warm and dry.  Neurological:     Mental Status: He is alert.      Assessment/Plan: Please see individual problem list.  Problem List Items Addressed This Visit    Hyperlipidemia    Well-controlled on Crestor.  He will continue Crestor 40 mg once daily.      Hypothyroidism    Well-controlled on last labs.  He will continue Synthroid 112 mcg once daily.      Lateral epicondylitis of left elbow    Symptoms and exam consistent with lateral epicondylitis.  Symptoms seem to be mild.  He will use a tennis elbow brace on the area.  Discussed icing with acute device 2-3 times daily for about 10 minutes.  If not improving over the next several weeks to let us know we could refer to orthopedics.      OSA  (obstructive sleep apnea)    Does not have any hypersomnia.  He does wake up well rested.  He has been using his CPAP.  We will request a compliance report to help determine benefit.         This visit occurred during the SARS-CoV-2 public health emergency.  Safety protocols were in place, including screening questions prior to the visit, additional usage of staff PPE, and extensive cleaning of exam room while observing appropriate contact time as indicated for disinfecting solutions.    Cory Rumps, MD [place]

## 2020-08-08 NOTE — Progress Notes (Signed)
Patient is on a loaner cpap and they do not do compliance reports on that.  Cory [name],cma

## 2020-08-08 NOTE — Assessment & Plan Note (Signed)
Well-controlled on Crestor.  He will continue Crestor 40 mg once daily.

## 2020-08-08 NOTE — Patient Instructions (Signed)
Nice to see you. Please try icing your elbow as we discussed.  You can use the tennis elbow brace as well.

## 2020-08-08 NOTE — Assessment & Plan Note (Signed)
Does not have any hypersomnia.  He does wake up well rested.  He has been using his CPAP.  We will request a compliance report to help determine benefit.

## 2020-08-08 NOTE — Assessment & Plan Note (Signed)
Well-controlled on last labs.  He will continue Synthroid 112 mcg once daily.

## 2020-08-08 NOTE — Assessment & Plan Note (Signed)
Symptoms and exam consistent with lateral epicondylitis.  Symptoms seem to be mild.  He will use a tennis elbow brace on the area.  Discussed icing with acute device 2-3 times daily for about 10 minutes.  If not improving over the next several weeks to let us know we could refer to orthopedics.

## 2020-09-05 DIAGNOSIS — G4733 Obstructive sleep apnea (adult) (pediatric): Secondary | ICD-10-CM | POA: Diagnosis not present

## 2020-09-07 ENCOUNTER — Other Ambulatory Visit: Payer: Self-pay | Admitting: Family Medicine

## 2020-10-04 DIAGNOSIS — C61 Malignant neoplasm of prostate: Secondary | ICD-10-CM | POA: Diagnosis not present

## 2020-10-11 DIAGNOSIS — F524 Premature ejaculation: Secondary | ICD-10-CM | POA: Diagnosis not present

## 2020-10-11 DIAGNOSIS — C61 Malignant neoplasm of prostate: Secondary | ICD-10-CM | POA: Diagnosis not present

## 2020-10-11 DIAGNOSIS — R3915 Urgency of urination: Secondary | ICD-10-CM | POA: Diagnosis not present

## 2020-12-06 ENCOUNTER — Other Ambulatory Visit: Payer: Self-pay | Admitting: Family Medicine

## 2021-01-27 DIAGNOSIS — J439 Emphysema, unspecified: Secondary | ICD-10-CM

## 2021-01-27 HISTORY — DX: Emphysema, unspecified: J43.9

## 2021-01-31 ENCOUNTER — Telehealth: Payer: Self-pay | Admitting: Family Medicine

## 2021-02-06 ENCOUNTER — Telehealth: Payer: Self-pay | Admitting: *Deleted

## 2021-02-06 ENCOUNTER — Ambulatory Visit (INDEPENDENT_AMBULATORY_CARE_PROVIDER_SITE_OTHER): Payer: BC Managed Care – PPO | Admitting: Family Medicine

## 2021-02-06 ENCOUNTER — Other Ambulatory Visit: Payer: Self-pay

## 2021-02-06 VITALS — BP 130/78 | HR 61 | Temp 97.6°F | Ht 67.0 in | Wt 159.6 lb

## 2021-02-06 DIAGNOSIS — E7800 Pure hypercholesterolemia, unspecified: Secondary | ICD-10-CM

## 2021-02-06 DIAGNOSIS — Z122 Encounter for screening for malignant neoplasm of respiratory organs: Secondary | ICD-10-CM

## 2021-02-06 DIAGNOSIS — Z87891 Personal history of nicotine dependence: Secondary | ICD-10-CM

## 2021-02-06 DIAGNOSIS — Z23 Encounter for immunization: Secondary | ICD-10-CM | POA: Diagnosis not present

## 2021-02-06 DIAGNOSIS — Z8546 Personal history of malignant neoplasm of prostate: Secondary | ICD-10-CM

## 2021-02-06 DIAGNOSIS — E039 Hypothyroidism, unspecified: Secondary | ICD-10-CM

## 2021-02-06 DIAGNOSIS — Z0000 Encounter for general adult medical examination without abnormal findings: Secondary | ICD-10-CM

## 2021-02-06 LAB — LIPID PANEL
Cholesterol: 146 mg/dL (ref 0–200)
HDL: 48.7 mg/dL (ref >39.00)
LDL Cholesterol: 70 mg/dL (ref 0–99)
NonHDL: 97.52
Total CHOL/HDL Ratio: 3
Triglycerides: 138 mg/dL (ref 0.0–149.0)
VLDL: 27.6 mg/dL (ref 0.0–40.0)

## 2021-02-06 LAB — COMPREHENSIVE METABOLIC PANEL
ALT: 20 U/L (ref 0–53)
AST: 19 U/L (ref 0–37)
Albumin: 4.5 g/dL (ref 3.5–5.2)
Alkaline Phosphatase: 76 U/L (ref 39–117)
BUN: 12 mg/dL (ref 6–23)
CO2: 28 mEq/L (ref 19–32)
Calcium: 9.4 mg/dL (ref 8.4–10.5)
Chloride: 102 mEq/L (ref 96–112)
Creatinine, Ser: 0.98 mg/dL (ref 0.40–1.50)
GFR: 84.2 mL/min (ref >60.00)
Glucose, Bld: 92 mg/dL (ref 70–99)
Potassium: 4.1 mEq/L (ref 3.5–5.1)
Sodium: 138 mEq/L (ref 135–145)
Total Bilirubin: 1.1 mg/dL (ref 0.2–1.2)
Total Protein: 6.7 g/dL (ref 6.0–8.3)

## 2021-02-06 LAB — TSH: TSH: 0.41 u[IU]/mL (ref 0.35–5.50)

## 2021-02-06 NOTE — Patient Instructions (Signed)
Nice to see you. Please try to cut down on your soda intake and increase your activity level. We will get lab work today.

## 2021-02-06 NOTE — Progress Notes (Signed)
Cory Rumps, MD Phone: [phone]  Cory [name] is a 60 y.o. male who presents today for CPE.  Diet: generally healthy, not a lot of fast food, does drink 12 sodas per week, not many sweets otherwise Exercise: disc golf 2x/month, has an active job Colonoscopy: 10/09/16 - 5 year recall Prostate cancer screening: follows with urology Family history-  Prostate cancer: brother age 33  Colon cancer: no Vaccines-   Flu: out of season  Tetanus: UTD  Shingles: UTD  COVID19: 2 shots, declines booster  Pneumonia: reports having pneumovax HIV screening: previously declined Hep C Screening: UTD Tobacco use: former smoker, quit 2015 Alcohol use: minimal Illicit Drug use: no Dentist: UTD Ophthalmology: occasionally sees them, no significant issues   Active Ambulatory Problems    Diagnosis Date Noted   History of prostate cancer 07/12/2016   Hypothyroidism 07/12/2016   Insomnia 07/12/2016   Hyperlipidemia 12/20/2016   Routine general medical examination at a health care facility 12/20/2016   History of tobacco abuse 06/17/2018   Internal hemorrhoids 11/12/2018   Nausea 12/26/2018   Bilateral elbow joint pain 06/29/2019   OSA (obstructive sleep apnea) 06/29/2019   Lateral epicondylitis of left elbow 08/08/2020   Resolved Ambulatory Problems    Diagnosis Date Noted   Colon cancer screening 07/12/2016   Past Medical History:  Diagnosis Date   Benign localized prostatic hyperplasia with lower urinary tract symptoms (LUTS)    COPD (chronic obstructive pulmonary disease) ([place])    Diverticulosis of colon    GERD (gastroesophageal reflux disease)    History of adenomatous polyp of colon    History of pneumothorax    Prostate cancer ([place]) dx 02-26-2016 via bx--  urologist-  dr Tresa Moore  oncologist-  dr Tammi Klippel    Family History  Problem Relation Age of Onset   Lung cancer Mother 41   Hyperlipidemia Mother    Hypertension Mother    Thyroid cancer Sister 73        thyroid/evironmental    Social History   Socioeconomic History   Marital status: Divorced    Spouse name: Not on file   Number of children: Not on file   Years of education: Not on file   Highest education level: Not on file  Occupational History   Not on file  Tobacco Use   Smoking status: Former    Packs/day: 1.00    Years: 38.00    Pack years: 38.00    Types: Cigarettes    Quit date: 2014    Years since quitting: 8.5   Smokeless tobacco: Never  Vaping Use   Vaping Use: Never used  Substance and Sexual Activity   Alcohol use: Yes    Alcohol/week: 1.0 standard drink    Types: 1 Cans of beer per week    Comment: FEW BEERS WEEK; consumed 1 beer on 11/13/2018   Drug use: No   Sexual activity: Not on file  Other Topics Concern   Not on file  Social History Narrative   Not on file   Social Determinants of Health   Financial Resource Strain: Not on file  Food Insecurity: Not on file  Transportation Needs: Not on file  Physical Activity: Not on file  Stress: Not on file  Social Connections: Not on file  Intimate Partner Violence: Not on file    ROS  General:  Negative for nexplained weight loss, fever Skin: Negative for new or changing mole, sore that won't heal HEENT: Negative for trouble hearing, trouble seeing,  ringing in ears, mouth sores, hoarseness, change in voice, dysphagia. CV:  Negative for chest pain, dyspnea, edema, palpitations Resp: Negative for cough, dyspnea, hemoptysis GI: Negative for nausea, vomiting, diarrhea, constipation, abdominal pain, melena, hematochezia. GU: Negative for dysuria, incontinence, urinary hesitance, hematuria, vaginal or penile discharge, polyuria, sexual difficulty, lumps in testicle or breasts MSK: Negative for muscle cramps or aches, joint pain or swelling Neuro: Negative for headaches, weakness, numbness, dizziness, passing out/fainting Psych: Negative for depression, anxiety, memory problems  Objective  Physical  Exam Vitals:   02/06/21 0826  BP: 130/78  Pulse: 61  Temp: 97.6 F (36.4 C)  SpO2: 97%    BP Readings from Last 3 Encounters:  02/06/21 130/78  08/08/20 130/70  05/06/20 118/79   Wt Readings from Last 3 Encounters:  02/06/21 159 lb 9.6 oz (72.4 kg)  08/08/20 169 lb 3.2 oz (76.7 kg)  05/06/20 162 lb 12.8 oz (73.8 kg)    Physical Exam Constitutional:      General: He is not in acute distress.    Appearance: He is not diaphoretic.  HENT:     Head: Normocephalic and atraumatic.  Eyes:     Conjunctiva/sclera: Conjunctivae normal.     Pupils: Pupils are equal, round, and reactive to light.  Cardiovascular:     Rate and Rhythm: Normal rate and regular rhythm.     Heart sounds: Normal heart sounds.  Pulmonary:     Effort: Pulmonary effort is normal.     Breath sounds: Normal breath sounds.  Abdominal:     General: Bowel sounds are normal. There is no distension.     Palpations: Abdomen is soft.     Tenderness: There is no abdominal tenderness. There is no guarding or rebound.  Musculoskeletal:     Right lower leg: No edema.     Left lower leg: No edema.  Lymphadenopathy:     Cervical: No cervical adenopathy.  Skin:    General: Skin is warm and dry.  Neurological:     Mental Status: He is alert.  Psychiatric:        Mood and Affect: Mood normal.     Assessment/Plan:   Problem List Items Addressed This Visit     History of prostate cancer   History of tobacco abuse   Relevant Orders   Ambulatory Referral for Lung Cancer Scre   Hyperlipidemia   Relevant Orders   Comp Met (CMET)   Lipid panel   Hypothyroidism   Relevant Orders   TSH   Routine general medical examination at a health care facility - Primary    Physical exam completed.  I encouraged healthy diet and exercise.  Discussed decreasing soda intake.  Discussed increasing activity level.  Prostate cancer follow-up is completed by his urologist.  He notes his colonoscopy cost him an excessive amount of  money and he may not consider doing this in the future.  He is not due until next year.  We will discuss at that time whether or not stool cards are a better option for him.  He declines COVID booster.  Prevnar 20 given today.  Referral for lung cancer screening provided.  I did encourage him to periodically see an eye doctor.  Lab work as outlined.       Other Visit Diagnoses     Need for pneumococcal vaccination       Relevant Orders   Pneumococcal conjugate vaccine 20-valent (Prevnar 20) (Completed)       Return in about  6 months (around 08/09/2021) for OSA, HLD.  This visit occurred during the SARS-CoV-2 public health emergency.  Safety protocols were in place, including screening questions prior to the visit, additional usage of staff PPE, and extensive cleaning of exam room while observing appropriate contact time as indicated for disinfecting solutions.    Cory Rumps, MD [place]

## 2021-02-06 NOTE — Telephone Encounter (Signed)
Spoke to patient via telephone re: scheduling his annual low dose lung screening scan. Former smoker, quit in 2014, 38 pack yrs. Scan scheduled for 02/13/21 @ 4:30 pm

## 2021-02-06 NOTE — Assessment & Plan Note (Signed)
Physical exam completed.  I encouraged healthy diet and exercise.  Discussed decreasing soda intake.  Discussed increasing activity level.  Prostate cancer follow-up is completed by his urologist.  He notes his colonoscopy cost him an excessive amount of money and he may not consider doing this in the future.  He is not due until next year.  We will discuss at that time whether or not stool cards are a better option for him.  He declines COVID booster.  Prevnar 20 given today.  Referral for lung cancer screening provided.  I did encourage him to periodically see an eye doctor.  Lab work as outlined.

## 2021-02-13 ENCOUNTER — Ambulatory Visit
Admission: RE | Admit: 2021-02-13 | Discharge: 2021-02-13 | Disposition: A | Discharge status: Home / Self Care | Payer: BC Managed Care – PPO | Source: Ambulatory Visit | Attending: Acute Care | Admitting: Acute Care

## 2021-02-13 ENCOUNTER — Other Ambulatory Visit: Payer: Self-pay

## 2021-02-13 DIAGNOSIS — Z122 Encounter for screening for malignant neoplasm of respiratory organs: Secondary | ICD-10-CM | POA: Diagnosis not present

## 2021-02-13 DIAGNOSIS — Z87891 Personal history of nicotine dependence: Secondary | ICD-10-CM

## 2021-02-17 ENCOUNTER — Encounter: Payer: Self-pay | Admitting: *Deleted

## 2021-02-20 ENCOUNTER — Telehealth: Payer: Self-pay | Admitting: Acute Care

## 2021-02-20 DIAGNOSIS — Z87891 Personal history of nicotine dependence: Secondary | ICD-10-CM

## 2021-02-20 NOTE — Telephone Encounter (Signed)
Cory Estelle, RN sent pt a letter with CT results on 02/17/21.  I have faxed results to PCP and placed order for 1 yr f/u low dose ct

## 2021-02-25 ENCOUNTER — Other Ambulatory Visit: Payer: Self-pay | Admitting: Family Medicine

## 2021-02-28 NOTE — Progress Notes (Signed)
Please call patient and let them  know their  low dose Ct was read as a Lung RADS 2: nodules that are benign in appearance and behavior with a very low likelihood of becoming a clinically active cancer due to size or lack of growth. Recommendation per radiology is for a repeat LDCT in 12 months. .Please let them  know we will order and schedule their  annual screening scan for 01/2022. Please let them  know there was notation of CAD on their  scan.  Please remind the patient  that this is a non-gated exam therefore degree or severity of disease  cannot be determined. Please have them  follow up with their PCP regarding potential risk factor modification, dietary therapy or pharmacologic therapy if clinically indicated. Pt.  is  currently on statin therapy. Please place order for annual  screening scan for  01/2022 and fax results to PCP. Thanks so much. There was notation of Atherosclerotic calcifications , please have the patient follow up with PCP.

## 2021-06-04 ENCOUNTER — Other Ambulatory Visit: Payer: Self-pay | Admitting: Family Medicine

## 2021-07-06 DIAGNOSIS — L820 Inflamed seborrheic keratosis: Secondary | ICD-10-CM | POA: Diagnosis not present

## 2021-07-06 DIAGNOSIS — D2262 Melanocytic nevi of left upper limb, including shoulder: Secondary | ICD-10-CM | POA: Diagnosis not present

## 2021-07-06 DIAGNOSIS — D485 Neoplasm of uncertain behavior of skin: Secondary | ICD-10-CM | POA: Diagnosis not present

## 2021-07-06 DIAGNOSIS — L821 Other seborrheic keratosis: Secondary | ICD-10-CM | POA: Diagnosis not present

## 2021-07-06 DIAGNOSIS — L814 Other melanin hyperpigmentation: Secondary | ICD-10-CM | POA: Diagnosis not present

## 2021-07-06 DIAGNOSIS — L905 Scar conditions and fibrosis of skin: Secondary | ICD-10-CM | POA: Diagnosis not present

## 2021-07-06 DIAGNOSIS — D225 Melanocytic nevi of trunk: Secondary | ICD-10-CM | POA: Diagnosis not present

## 2021-08-09 ENCOUNTER — Ambulatory Visit: Payer: BC Managed Care – PPO | Admitting: Family Medicine

## 2021-08-23 ENCOUNTER — Other Ambulatory Visit: Payer: Self-pay | Admitting: Family Medicine

## 2021-08-25 ENCOUNTER — Other Ambulatory Visit: Payer: Self-pay

## 2021-08-25 ENCOUNTER — Encounter: Payer: Self-pay | Admitting: Family Medicine

## 2021-08-25 ENCOUNTER — Other Ambulatory Visit: Payer: Self-pay | Admitting: Family Medicine

## 2021-08-25 ENCOUNTER — Ambulatory Visit: Payer: Managed Care, Other (non HMO) | Admitting: Family Medicine

## 2021-08-25 DIAGNOSIS — F524 Premature ejaculation: Secondary | ICD-10-CM

## 2021-08-25 DIAGNOSIS — E7800 Pure hypercholesterolemia, unspecified: Secondary | ICD-10-CM | POA: Diagnosis not present

## 2021-08-25 DIAGNOSIS — G4733 Obstructive sleep apnea (adult) (pediatric): Secondary | ICD-10-CM

## 2021-08-25 DIAGNOSIS — F5101 Primary insomnia: Secondary | ICD-10-CM

## 2021-08-25 DIAGNOSIS — E039 Hypothyroidism, unspecified: Secondary | ICD-10-CM | POA: Diagnosis not present

## 2021-08-25 DIAGNOSIS — L219 Seborrheic dermatitis, unspecified: Secondary | ICD-10-CM

## 2021-08-25 LAB — HEPATIC FUNCTION PANEL
ALT: 17 U/L (ref 0–53)
AST: 18 U/L (ref 0–37)
Albumin: 4.4 g/dL (ref 3.5–5.2)
Alkaline Phosphatase: 86 U/L (ref 39–117)
Bilirubin, Direct: 0.3 mg/dL (ref 0.0–0.3)
Total Bilirubin: 1.4 mg/dL — ABNORMAL HIGH (ref 0.2–1.2)
Total Protein: 6.6 g/dL (ref 6.0–8.3)

## 2021-08-25 LAB — TSH: TSH: 1.44 u[IU]/mL (ref 0.35–5.50)

## 2021-08-25 LAB — LDL CHOLESTEROL, DIRECT: Direct LDL: 71 mg/dL

## 2021-08-25 NOTE — Assessment & Plan Note (Signed)
Check TSH.  Continue Synthroid 112 mcg once daily.

## 2021-08-25 NOTE — Progress Notes (Signed)
Tommi Rumps, MD Phone: [phone]  Cory [name] is a 61 y.o. male who presents today for f/u.  OSA CPAP use: not using, could not get the required receipts to use his FSA Hypersomnia: mild tiredness Well rested: no Does not think he would be able to use a oral device given that he cannot keep a bite guard in his mouth at night.  HYPOTHYROIDISM Disease Monitoring Weight changes: no  Skin Changes: no Heat/Cold intolerance: notes some feeling cold  Medication Monitoring Compliance:  taking synthroid Last TSH:   Lab Results  Component Value Date   TSH 0.41 02/06/2021   HYPERLIPIDEMIA Symptoms Chest pain on exertion:  no   Leg claudication:   no Medications: Compliance- taking crestor Right upper quadrant pain- no  Muscle aches- no Lipid Panel     Component Value Date/Time   CHOL 146 02/06/2021 0907   TRIG 138.0 02/06/2021 0907   HDL 48.70 02/06/2021 0907   CHOLHDL 3 02/06/2021 0907   VLDL 27.6 02/06/2021 0907   LDLCALC 70 02/06/2021 0907   LDLCALC 76 05/06/2020 1600   LDLDIRECT 93.0 08/25/2018 0829   Insomnia: Patient reports Remeron is helpful.  It does not make him drowsy.  If he does not take it he wakes up at 2 AM.  He notes no anxiety or depression.  Seborrheic dermatitis: Patient reports he occasionally gets this in his eyebrows and over his nose.  He saw his dermatologist previously for this and they advised to use hydrocortisone.  He exfoliates as well.  These things are beneficial.  Premature ejaculation: Patient notes he occasionally takes Paxil for this and this is beneficial.   Social History   Tobacco Use  Smoking Status Former   Packs/day: 1.00   Years: 38.00   Pack years: 38.00   Types: Cigarettes   Quit date: 2014   Years since quitting: 9.0  Smokeless Tobacco Never    Current Outpatient Medications on File Prior to Visit  Medication Sig Dispense Refill   famciclovir (FAMVIR) 500 MG tablet Take 1 tablet (500 mg total) by mouth 2  (two) times daily. For 7 days at first sign of cold sore. 14 tablet 1   levothyroxine (SYNTHROID) 112 MCG tablet TAKE 1 TABLET BY MOUTH EVERY DAY 90 tablet 1   mirtazapine (REMERON) 30 MG tablet TAKE 1 TABLET BY MOUTH EVERYDAY AT BEDTIME 90 tablet 1   PARoxetine (PAXIL) 20 MG tablet Take 20 mg by mouth daily as needed (ED).     rosuvastatin (CRESTOR) 40 MG tablet TAKE 1 TABLET BY MOUTH EVERY DAY 90 tablet 3   tadalafil (CIALIS) 20 MG tablet Take 20 mg by mouth daily.     HYDROcodone-acetaminophen (NORCO) 5-325 MG tablet Take 1 tablet by mouth every 4 (four) hours as needed for moderate pain. (Patient not taking: Reported on 08/25/2021) 30 tablet 0   hydrocortisone-pramoxine (ANALPRAM-HC) 2.5-1 % rectal cream Apply a small amount to the external anal tissue three times a day if needed for comfort. (Patient not taking: Reported on 08/25/2021) 30 g 1   tamsulosin (FLOMAX) 0.4 MG CAPS capsule Take 0.4 mg by mouth daily. (Patient not taking: Reported on 08/25/2021)     No current facility-administered medications on file prior to visit.     ROS see history of present illness  Objective  Physical Exam Vitals:   08/25/21 1011  BP: 110/80  Pulse: 79  Temp: 98.1 F (36.7 C)  SpO2: 98%    BP Readings from Last 3 Encounters:  08/25/21 110/80  02/06/21 130/78  08/08/20 130/70   Wt Readings from Last 3 Encounters:  08/25/21 161 lb (73 kg)  02/13/21 159 lb (72.1 kg)  02/06/21 159 lb 9.6 oz (72.4 kg)    Physical Exam Constitutional:      General: He is not in acute distress.    Appearance: He is not diaphoretic.  Cardiovascular:     Rate and Rhythm: Normal rate and regular rhythm.     Heart sounds: Normal heart sounds.  Pulmonary:     Effort: Pulmonary effort is normal.     Breath sounds: Normal breath sounds.  Musculoskeletal:     Right lower leg: No edema.     Left lower leg: No edema.  Skin:    General: Skin is warm and dry.     Comments: No significant seborrheic dermatitis in  his eyebrows  Neurological:     Mental Status: He is alert.     Assessment/Plan: Please see individual problem list.  Problem List Items Addressed This Visit     Hyperlipidemia    Continue Crestor 40 mg once daily.  Check labs.      Relevant Orders   Direct LDL   Hepatic function panel   Hypothyroidism    Check TSH.  Continue Synthroid 112 mcg once daily.      Relevant Orders   TSH   Insomnia    Chronic issue.  Adequately controlled on mirtazapine 30 mg daily.  He can continue this.      OSA (obstructive sleep apnea)    Currently untreated.  I discussed the risk of cardiovascular issues, lung issues, stroke, and atrial fibrillation with untreated sleep apnea.  I provided him the name of the inspire sleep system and he will do research on this to determine if he would like to see a specialist to discuss this option.      Premature ejaculation    Adequately treated with as needed Paxil.      Seborrheic dermatitis    Adequately controlled with over-the-counter hydrocortisone.  I advised that he should not get this in his eyes or on his eyelids given risk of glaucoma and skin atrophy respectively.        Return in about 6 months (around 02/22/2022) for CPE.  This visit occurred during the SARS-CoV-2 public health emergency.  Safety protocols were in place, including screening questions prior to the visit, additional usage of staff PPE, and extensive cleaning of exam room while observing appropriate contact time as indicated for disinfecting solutions.    Tommi Rumps, MD [place]

## 2021-08-25 NOTE — Assessment & Plan Note (Signed)
Adequately controlled with over-the-counter hydrocortisone.  I advised that he should not get this in his eyes or on his eyelids given risk of glaucoma and skin atrophy respectively.

## 2021-08-25 NOTE — Assessment & Plan Note (Signed)
Continue Crestor 40 mg once daily.  Check labs.

## 2021-08-25 NOTE — Patient Instructions (Signed)
Nice to see you. We will get lab work today. Please look into the Inspire sleep apnea treatment.  If this is something you would like to consider please let me know and I can get you referred to a specialist.

## 2021-08-25 NOTE — Assessment & Plan Note (Signed)
Chronic issue.  Adequately controlled on mirtazapine 30 mg daily.  He can continue this.

## 2021-08-25 NOTE — Assessment & Plan Note (Signed)
Currently untreated.  I discussed the risk of cardiovascular issues, lung issues, stroke, and atrial fibrillation with untreated sleep apnea.  I provided him the name of the inspire sleep system and he will do research on this to determine if he would like to see a specialist to discuss this option.

## 2021-08-25 NOTE — Assessment & Plan Note (Signed)
Adequately treated with as needed Paxil.

## 2021-09-01 ENCOUNTER — Other Ambulatory Visit: Payer: Self-pay | Admitting: Family Medicine

## 2021-09-14 ENCOUNTER — Other Ambulatory Visit: Payer: Self-pay

## 2021-09-14 ENCOUNTER — Other Ambulatory Visit (INDEPENDENT_AMBULATORY_CARE_PROVIDER_SITE_OTHER): Payer: Managed Care, Other (non HMO)

## 2021-09-15 LAB — HEPATIC FUNCTION PANEL
ALT: 23 U/L (ref 0–53)
AST: 23 U/L (ref 0–37)
Albumin: 4.6 g/dL (ref 3.5–5.2)
Alkaline Phosphatase: 86 U/L (ref 39–117)
Bilirubin, Direct: 0.2 mg/dL (ref 0.0–0.3)
Total Bilirubin: 1.4 mg/dL — ABNORMAL HIGH (ref 0.2–1.2)
Total Protein: 6.7 g/dL (ref 6.0–8.3)

## 2021-09-18 ENCOUNTER — Other Ambulatory Visit: Payer: Self-pay | Admitting: Family Medicine

## 2021-12-02 ENCOUNTER — Other Ambulatory Visit: Payer: Self-pay | Admitting: Family Medicine

## 2021-12-07 ENCOUNTER — Encounter: Payer: Self-pay | Admitting: Gastroenterology

## 2022-02-05 ENCOUNTER — Telehealth: Payer: Self-pay | Admitting: Acute Care

## 2022-02-13 ENCOUNTER — Ambulatory Visit: Payer: Managed Care, Other (non HMO)

## 2022-02-20 ENCOUNTER — Ambulatory Visit
Admission: RE | Admit: 2022-02-20 | Discharge: 2022-02-20 | Disposition: A | Discharge status: Home / Self Care | Payer: Managed Care, Other (non HMO) | Source: Ambulatory Visit | Attending: Acute Care | Admitting: Acute Care

## 2022-02-20 DIAGNOSIS — Z87891 Personal history of nicotine dependence: Secondary | ICD-10-CM

## 2022-02-22 ENCOUNTER — Other Ambulatory Visit: Payer: Self-pay | Admitting: Acute Care

## 2022-02-22 DIAGNOSIS — Z122 Encounter for screening for malignant neoplasm of respiratory organs: Secondary | ICD-10-CM

## 2022-02-22 DIAGNOSIS — Z87891 Personal history of nicotine dependence: Secondary | ICD-10-CM

## 2022-02-23 ENCOUNTER — Encounter: Payer: Self-pay | Admitting: Family Medicine

## 2022-02-23 ENCOUNTER — Other Ambulatory Visit: Payer: Self-pay | Admitting: Family Medicine

## 2022-02-23 ENCOUNTER — Ambulatory Visit (INDEPENDENT_AMBULATORY_CARE_PROVIDER_SITE_OTHER): Payer: Managed Care, Other (non HMO) | Admitting: Family Medicine

## 2022-02-23 VITALS — BP 140/80 | HR 75 | Temp 98.2°F | Ht 67.0 in | Wt 156.8 lb

## 2022-02-23 DIAGNOSIS — E7800 Pure hypercholesterolemia, unspecified: Secondary | ICD-10-CM

## 2022-02-23 DIAGNOSIS — Z0000 Encounter for general adult medical examination without abnormal findings: Secondary | ICD-10-CM | POA: Diagnosis not present

## 2022-02-23 DIAGNOSIS — E039 Hypothyroidism, unspecified: Secondary | ICD-10-CM | POA: Diagnosis not present

## 2022-02-23 DIAGNOSIS — R030 Elevated blood-pressure reading, without diagnosis of hypertension: Secondary | ICD-10-CM

## 2022-02-23 NOTE — Progress Notes (Signed)
Cory Rumps, MD Phone: [phone]  Cory [name] is a 61 y.o. male who presents today for CPE.  Diet: healthy, eating at home, not too many fried, fatty, or sugary foods Exercise: gets 7,000-15000 steps daily Colonoscopy: declines given potential cost Prostate cancer screening: followed by urology Family history-  Prostate cancer: brother  Colon cancer: no Vaccines-   Flu: out of season  Tetanus: UTD  Shingles: UTD  COVID19: x2  Pneumonia: UTD HIV screening: Deferred previously Hep C Screening: UTD Tobacco use: former smoker, is completing lung cancer ct scans yearly Alcohol use: rarely Illicit Drug use: no Dentist: yes Ophthalmology: yes   Active Ambulatory Problems    Diagnosis Date Noted   History of prostate cancer 07/12/2016   Hypothyroidism 07/12/2016   Insomnia 07/12/2016   Hyperlipidemia 12/20/2016   Routine general medical examination at a health care facility 12/20/2016   History of tobacco abuse 06/17/2018   Internal hemorrhoids 11/12/2018   OSA (obstructive sleep apnea) 06/29/2019   Premature ejaculation 08/25/2021   Seborrheic dermatitis 08/25/2021   Elevated BP without diagnosis of hypertension 02/23/2022   Resolved Ambulatory Problems    Diagnosis Date Noted   Colon cancer screening 07/12/2016   Nausea 12/26/2018   Bilateral elbow joint pain 06/29/2019   Lateral epicondylitis of left elbow 08/08/2020   Past Medical History:  Diagnosis Date   Benign localized prostatic hyperplasia with lower urinary tract symptoms (LUTS)    COPD (chronic obstructive pulmonary disease) ([place])    Diverticulosis of colon    Emphysema, unspecified ([place]) 01/2021   GERD (gastroesophageal reflux disease)    History of adenomatous polyp of colon    History of pneumothorax    Prostate cancer ([place]) dx 02-26-2016 via bx--  urologist-  dr Tresa Moore  oncologist-  dr Tammi Klippel    Family History  Problem Relation Age of Onset   Lung cancer Mother 70    Hyperlipidemia Mother    Hypertension Mother    Thyroid cancer Sister 39       thyroid/evironmental    Social History   Socioeconomic History   Marital status: Divorced    Spouse name: Not on file   Number of children: Not on file   Years of education: Not on file   Highest education level: Not on file  Occupational History   Not on file  Tobacco Use   Smoking status: Former    Packs/day: 1.00    Years: 38.00    Total pack years: 38.00    Types: Cigarettes    Quit date: 2014    Years since quitting: 9.5   Smokeless tobacco: Never  Vaping Use   Vaping Use: Never used  Substance and Sexual Activity   Alcohol use: Yes    Alcohol/week: 1.0 standard drink of alcohol    Types: 1 Cans of beer per week    Comment: FEW BEERS WEEK; consumed 1 beer on 11/13/2018   Drug use: No   Sexual activity: Not on file  Other Topics Concern   Not on file  Social History Narrative   Not on file   Social Determinants of Health   Financial Resource Strain: Not on file  Food Insecurity: Not on file  Transportation Needs: Not on file  Physical Activity: Not on file  Stress: Not on file  Social Connections: Not on file  Intimate Partner Violence: Not on file    ROS  General:  Negative for nexplained weight loss, fever Skin: Negative for new or changing mole,  sore that won't heal HEENT: Negative for trouble hearing, trouble seeing, ringing in ears, mouth sores, hoarseness, change in voice, dysphagia. CV:  Negative for chest pain, dyspnea, edema, palpitations Resp: Negative for cough, dyspnea, hemoptysis GI: Negative for nausea, vomiting, diarrhea, constipation, abdominal pain, melena, hematochezia. GU: Negative for dysuria, incontinence, urinary hesitance, hematuria, vaginal or penile discharge, polyuria, sexual difficulty, lumps in testicle or breasts MSK: Negative for muscle cramps or aches, joint pain or swelling Neuro: Negative for headaches, weakness, numbness, dizziness, passing  out/fainting Psych: Negative for depression, anxiety, memory problems  Objective  Physical Exam Vitals:   02/23/22 1523 02/23/22 1543  BP: (!) 142/80 140/80  Pulse: 75   Temp: 98.2 F (36.8 C)   SpO2: 98%     BP Readings from Last 3 Encounters:  02/23/22 140/80  08/25/21 110/80  02/06/21 130/78   Wt Readings from Last 3 Encounters:  02/23/22 156 lb 12.8 oz (71.1 kg)  08/25/21 161 lb (73 kg)  02/13/21 159 lb (72.1 kg)    Physical Exam Constitutional:      General: He is not in acute distress.    Appearance: He is not diaphoretic.  Cardiovascular:     Rate and Rhythm: Normal rate and regular rhythm.     Heart sounds: Normal heart sounds.  Pulmonary:     Effort: Pulmonary effort is normal.     Breath sounds: Normal breath sounds.  Abdominal:     General: Bowel sounds are normal. There is no distension.     Palpations: Abdomen is soft.     Tenderness: There is no abdominal tenderness.  Musculoskeletal:     Right lower leg: No edema.     Left lower leg: No edema.  Lymphadenopathy:     Cervical: No cervical adenopathy.  Skin:    General: Skin is warm and dry.  Neurological:     Mental Status: He is alert.  Psychiatric:        Mood and Affect: Mood normal.      Assessment/Plan:   Problem List Items Addressed This Visit     Elevated BP without diagnosis of hypertension    Has been previously well controlled.  We will plan on having him back in 3 months to recheck his blood pressure.  If still elevated at that time we will consider treatment.      Hyperlipidemia   Relevant Orders   Comp Met (CMET)   Lipid panel   Hypothyroidism   Relevant Orders   TSH   Routine general medical examination at a health care facility - Primary    CPE completed today.  Encouraged continued healthy diet and exercise.  We discussed colon cancer screening though the patient notes he is responsible for the full cost of his colonoscopy given that it would be a diagnostic study and  he is refusing to have that done given the cost.  We did discuss stool testing with yearly testing though he was not completely sure about that at this time.  The patient is aware that we could be missing a polyp that can turn into cancer or we could be missing a cancer.  He declines further COVID vaccination.  He will continue yearly lung cancer screening.  Lab work as outlined.       Return in about 3 months (around 05/26/2022) for Blood pressure follow-up, 1 year CPE.   Cory Rumps, MD [place]

## 2022-02-23 NOTE — Assessment & Plan Note (Signed)
CPE completed today.  Encouraged continued healthy diet and exercise.  We discussed colon cancer screening though the patient notes he is responsible for the full cost of his colonoscopy given that it would be a diagnostic study and he is refusing to have that done given the cost.  We did discuss stool testing with yearly testing though he was not completely sure about that at this time.  The patient is aware that we could be missing a polyp that can turn into cancer or we could be missing a cancer.  He declines further COVID vaccination.  He will continue yearly lung cancer screening.  Lab work as outlined.

## 2022-02-23 NOTE — Assessment & Plan Note (Signed)
Has been previously well controlled.  We will plan on having him back in 3 months to recheck his blood pressure.  If still elevated at that time we will consider treatment.

## 2022-02-23 NOTE — Patient Instructions (Signed)
Nice to see you. Please continue healthy diet and exercise. We will get lab work today and contact you with the results. If you would like to do the yearly stool test for your colon cancer screening please let me know.

## 2022-02-24 LAB — COMPREHENSIVE METABOLIC PANEL
AG Ratio: 2.1 (calc) (ref 1.0–2.5)
ALT: 23 U/L (ref 9–46)
AST: 19 U/L (ref 10–35)
Albumin: 4.6 g/dL (ref 3.6–5.1)
Alkaline phosphatase (APISO): 77 U/L (ref 35–144)
BUN: 17 mg/dL (ref 7–25)
CO2: 22 mmol/L (ref 20–32)
Calcium: 9.1 mg/dL (ref 8.6–10.3)
Chloride: 105 mmol/L (ref 98–110)
Creat: 1.17 mg/dL (ref 0.70–1.35)
Globulin: 2.2 g/dL (calc) (ref 1.9–3.7)
Glucose, Bld: 90 mg/dL (ref 65–99)
Potassium: 3.9 mmol/L (ref 3.5–5.3)
Sodium: 138 mmol/L (ref 135–146)
Total Bilirubin: 1.6 mg/dL — ABNORMAL HIGH (ref 0.2–1.2)
Total Protein: 6.8 g/dL (ref 6.1–8.1)

## 2022-02-24 LAB — LIPID PANEL
Cholesterol: 136 mg/dL (ref <200)
HDL: 45 mg/dL (ref >=40)
LDL Cholesterol (Calc): 70 mg/dL (calc)
Non-HDL Cholesterol (Calc): 91 mg/dL (calc) (ref <130)
Total CHOL/HDL Ratio: 3 (calc) (ref <5.0)
Triglycerides: 130 mg/dL (ref <150)

## 2022-02-24 LAB — TSH: TSH: 0.51 mIU/L (ref 0.40–4.50)

## 2022-03-01 ENCOUNTER — Other Ambulatory Visit (INDEPENDENT_AMBULATORY_CARE_PROVIDER_SITE_OTHER): Payer: Managed Care, Other (non HMO)

## 2022-03-01 LAB — CBC
HCT: 43.2 % (ref 39.0–52.0)
Hemoglobin: 14.6 g/dL (ref 13.0–17.0)
MCHC: 33.9 g/dL (ref 30.0–36.0)
MCV: 89.1 fl (ref 78.0–100.0)
Platelets: 228 10*3/uL (ref 150.0–400.0)
RBC: 4.84 Mil/uL (ref 4.22–5.81)
RDW: 13.7 % (ref 11.5–15.5)
WBC: 6.2 10*3/uL (ref 4.0–10.5)

## 2022-03-01 LAB — RETICULOCYTES
ABS Retic: 54890 cells/uL (ref 25000–90000)
Retic Ct Pct: 1.1 %

## 2022-03-02 LAB — HAPTOGLOBIN: Haptoglobin: 144 mg/dL (ref 43–212)

## 2022-03-14 NOTE — Telephone Encounter (Signed)
CT performed 7/23.

## 2022-05-28 ENCOUNTER — Encounter: Payer: Self-pay | Admitting: Family Medicine

## 2022-05-28 ENCOUNTER — Ambulatory Visit: Payer: Managed Care, Other (non HMO) | Admitting: Family Medicine

## 2022-05-28 VITALS — BP 130/70 | HR 79 | Temp 98.3°F | Ht 67.0 in | Wt 164.0 lb

## 2022-05-28 DIAGNOSIS — R030 Elevated blood-pressure reading, without diagnosis of hypertension: Secondary | ICD-10-CM | POA: Diagnosis not present

## 2022-05-28 DIAGNOSIS — G4733 Obstructive sleep apnea (adult) (pediatric): Secondary | ICD-10-CM

## 2022-05-28 NOTE — Assessment & Plan Note (Signed)
Currently untreated. Discussed risk of cardiovascular issues, lung issues, and blood pressure issues associated with sleep apnea. Offered patient an ENT consult for the Inspire sleep system and he is open to seeing them for a consultation. Will refer to ENT.

## 2022-05-28 NOTE — Progress Notes (Signed)
Patient seen along with medical student Zada Zhong.  I personally evaluated this patient along with the student, and verified all aspects of the history, physical exam, and medical decision making as documented by the student.  I agree with the student's documentation and have made all necessary edits.  Dusten Ellinwood, MD  

## 2022-05-28 NOTE — Assessment & Plan Note (Addendum)
Well-controlled at home. Given his normal blood pressures at home, will consider treatment at next visit if elevated. Encouraged patient to continue with exercise, maintain a healthy diet, and refer to ENT for possible Inspire device placement for OSA as adequately controlled OSA may help lower his blood pressure. Will plan to recheck blood pressure at his next physical in July.

## 2022-05-28 NOTE — Progress Notes (Signed)
Tommi Rumps, MD Phone: [phone]  Cory [name] is a 61 y.o. male who presents today for follow-up.  Hypertension: Patient reports that he has been checking his blood pressures at home. They run around 120s/70s at home. He states that he doesn't put extra salt in his food and prefers his food unsalted. He mostly eats lean meats, fruits, and vegetables. He plays disc golf a couple times a week and is active at work.  OSA: Patient completed sleep studies but did not get a CPAP due to cost. He is interested in the [place] device that can be placed by ENT.   Social History   Tobacco Use  Smoking Status Former   Packs/day: 1.00   Years: 38.00   Total pack years: 38.00   Types: Cigarettes   Quit date: 2014   Years since quitting: 9.8  Smokeless Tobacco Never    Current Outpatient Medications on File Prior to Visit  Medication Sig Dispense Refill   famciclovir (FAMVIR) 500 MG tablet Take 1 tablet (500 mg total) by mouth 2 (two) times daily. For 7 days at first sign of cold sore. 14 tablet 1   levothyroxine (SYNTHROID) 112 MCG tablet TAKE 1 TABLET BY MOUTH EVERY DAY 90 tablet 1   meloxicam (MOBIC) 7.5 MG tablet Take 7.5 mg by mouth daily.     mirtazapine (REMERON) 30 MG tablet TAKE 1 TABLET BY MOUTH EVERYDAY AT BEDTIME 90 tablet 1   PARoxetine (PAXIL) 20 MG tablet Take 20 mg by mouth daily as needed (ED).     rosuvastatin (CRESTOR) 40 MG tablet TAKE 1 TABLET BY MOUTH EVERY DAY 90 tablet 3   tadalafil (CIALIS) 20 MG tablet Take 20 mg by mouth daily.     No current facility-administered medications on file prior to visit.     ROS see history of present illness  Objective  Vitals:   05/28/22 1601  BP: 130/70  Pulse: 79  Temp: 98.3 F (36.8 C)  SpO2: 96%    BP Readings from Last 3 Encounters:  05/28/22 130/70  02/23/22 140/80  08/25/21 110/80   Wt Readings from Last 3 Encounters:  05/28/22 164 lb (74.4 kg)  02/23/22 156 lb 12.8 oz (71.1 kg)  08/25/21 161 lb  (73 kg)    Physical Exam Constitutional:      Appearance: Normal appearance.  HENT:     Head: Normocephalic and atraumatic.  Cardiovascular:     Rate and Rhythm: Normal rate and regular rhythm.  Pulmonary:     Effort: Pulmonary effort is normal.     Breath sounds: Normal breath sounds.  Skin:    General: Skin is warm and dry.  Neurological:     Mental Status: He is alert.  Psychiatric:        Mood and Affect: Mood normal.    Assessment/Plan: Please see individual problem list.  Problem List Items Addressed This Visit     Elevated BP without diagnosis of hypertension    Well-controlled at home. Given his normal blood pressures at home, will consider treatment at next visit if elevated. Encouraged patient to continue with exercise, maintain a healthy diet, and refer to ENT for possible Inspire device placement for OSA as adequately controlled OSA may help lower his blood pressure. Will plan to recheck blood pressure at his next physical in July.       OSA (obstructive sleep apnea) - Primary    Currently untreated. Discussed risk of cardiovascular issues, lung issues, and blood pressure issues  associated with sleep apnea. Offered patient an ENT consult for the Inspire sleep system and he is open to seeing them for a consultation. Will refer to ENT.       Relevant Orders   Ambulatory referral to ENT      Follow-up in July for physical and blood pressure check.   Marisa Cyphers, Medical Student [place]

## 2022-06-06 ENCOUNTER — Other Ambulatory Visit: Payer: Self-pay | Admitting: Family Medicine

## 2022-08-25 ENCOUNTER — Other Ambulatory Visit: Payer: Self-pay | Admitting: Family Medicine

## 2022-09-07 ENCOUNTER — Other Ambulatory Visit: Payer: Self-pay | Admitting: Family Medicine

## 2022-11-06 ENCOUNTER — Other Ambulatory Visit: Payer: Self-pay | Admitting: Family Medicine

## 2023-02-19 ENCOUNTER — Encounter: Payer: Self-pay | Admitting: Acute Care

## 2023-02-22 ENCOUNTER — Ambulatory Visit
Admission: RE | Admit: 2023-02-22 | Discharge: 2023-02-22 | Disposition: A | Discharge status: Home / Self Care | Payer: No Typology Code available for payment source | Source: Ambulatory Visit | Attending: Family Medicine | Admitting: Family Medicine

## 2023-02-22 DIAGNOSIS — Z87891 Personal history of nicotine dependence: Secondary | ICD-10-CM

## 2023-02-22 DIAGNOSIS — Z122 Encounter for screening for malignant neoplasm of respiratory organs: Secondary | ICD-10-CM

## 2023-02-24 ENCOUNTER — Other Ambulatory Visit: Payer: Self-pay | Admitting: Family Medicine

## 2023-02-27 ENCOUNTER — Encounter: Payer: Managed Care, Other (non HMO) | Admitting: Family Medicine

## 2023-03-04 ENCOUNTER — Encounter: Payer: Managed Care, Other (non HMO) | Admitting: Family Medicine

## 2023-03-04 ENCOUNTER — Other Ambulatory Visit: Payer: Self-pay | Admitting: Acute Care

## 2023-03-04 DIAGNOSIS — Z87891 Personal history of nicotine dependence: Secondary | ICD-10-CM

## 2023-03-04 DIAGNOSIS — Z122 Encounter for screening for malignant neoplasm of respiratory organs: Secondary | ICD-10-CM

## 2023-04-17 ENCOUNTER — Ambulatory Visit (INDEPENDENT_AMBULATORY_CARE_PROVIDER_SITE_OTHER): Payer: BC Managed Care – PPO | Admitting: Family Medicine

## 2023-04-17 ENCOUNTER — Encounter: Payer: Self-pay | Admitting: Family Medicine

## 2023-04-17 VITALS — BP 118/78 | HR 69 | Temp 97.8°F | Ht 67.0 in | Wt 160.4 lb

## 2023-04-17 DIAGNOSIS — R251 Tremor, unspecified: Secondary | ICD-10-CM | POA: Diagnosis not present

## 2023-04-17 DIAGNOSIS — Z1211 Encounter for screening for malignant neoplasm of colon: Secondary | ICD-10-CM

## 2023-04-17 DIAGNOSIS — E663 Overweight: Secondary | ICD-10-CM

## 2023-04-17 DIAGNOSIS — E7800 Pure hypercholesterolemia, unspecified: Secondary | ICD-10-CM | POA: Diagnosis not present

## 2023-04-17 DIAGNOSIS — E039 Hypothyroidism, unspecified: Secondary | ICD-10-CM

## 2023-04-17 DIAGNOSIS — Z0001 Encounter for general adult medical examination with abnormal findings: Secondary | ICD-10-CM | POA: Diagnosis not present

## 2023-04-17 LAB — COMPREHENSIVE METABOLIC PANEL WITH GFR
ALT: 20 U/L (ref 0–53)
AST: 20 U/L (ref 0–37)
Albumin: 4.3 g/dL (ref 3.5–5.2)
Alkaline Phosphatase: 72 U/L (ref 39–117)
BUN: 18 mg/dL (ref 6–23)
CO2: 30 meq/L (ref 19–32)
Calcium: 9.2 mg/dL (ref 8.4–10.5)
Chloride: 102 meq/L (ref 96–112)
Creatinine, Ser: 1.09 mg/dL (ref 0.40–1.50)
GFR: 72.98 mL/min (ref >60.00)
Glucose, Bld: 96 mg/dL (ref 70–99)
Potassium: 4.3 meq/L (ref 3.5–5.1)
Sodium: 138 meq/L (ref 135–145)
Total Bilirubin: 1.2 mg/dL (ref 0.2–1.2)
Total Protein: 6.3 g/dL (ref 6.0–8.3)

## 2023-04-17 LAB — HEMOGLOBIN A1C: Hgb A1c MFr Bld: 5.8 % (ref 4.6–6.5)

## 2023-04-17 LAB — TSH: TSH: 1.63 u[IU]/mL (ref 0.35–5.50)

## 2023-04-17 LAB — LIPID PANEL
Cholesterol: 154 mg/dL (ref 0–200)
HDL: 55.1 mg/dL (ref >39.00)
LDL Cholesterol: 83 mg/dL (ref 0–99)
NonHDL: 99.3
Total CHOL/HDL Ratio: 3
Triglycerides: 84 mg/dL (ref 0.0–149.0)
VLDL: 16.8 mg/dL (ref 0.0–40.0)

## 2023-04-17 NOTE — Assessment & Plan Note (Signed)
Physical exam completed.  Encouraged healthy diet and exercise.  GI referral placed for colon cancer screening.  Patient declines flu and further COVID vaccinations.  Patient is up-to-date on lung cancer screening.  I encouraged him to see ophthalmologist.  Patient declines HIV screening noting that he is low risk.  Lab work as outlined.

## 2023-04-17 NOTE — Assessment & Plan Note (Signed)
Suspect essential tremor based on history and exam.  Discussed risk of medication management of this would likely outweigh any benefit he would get right now.  We will check a TSH.  He will monitor.

## 2023-04-17 NOTE — Progress Notes (Signed)
Marikay Alar, MD Phone: [phone]  Cory [name] is a 62 y.o. male who presents today for CPE./  Diet: Patient has reduced processed foods, generally no soda, not as many sweets, does get some fruits and vegetables Exercise: 15,000 steps per day with his new job Colonoscopy: due, patient does report some bleeding with hard stools Prostate cancer screening: through urology Family history-  Prostate cancer: brother  Colon cancer: no Vaccines-   Flu: declines  Tetanus: UTD  Shingles: UTD  COVID19: x2, declines further HIV screening: declines Hep C Screening: UTD Tobacco use: former Alcohol use: minimal Illicit Drug use: no Dentist: yes Ophthalmology: due Patient reports left thumb tremor that is been going on recently.  Notes this started before medication change to Seroquel.  He notes no resting tremor.  Notes it typically occurs when he is trying to do something with his hand.   Active Ambulatory Problems    Diagnosis Date Noted   History of prostate cancer 07/12/2016   Hypothyroidism 07/12/2016   Insomnia 07/12/2016   Hyperlipidemia 12/20/2016   Encounter for general adult medical examination with abnormal findings 12/20/2016   History of tobacco abuse 06/17/2018   Internal hemorrhoids 11/12/2018   OSA (obstructive sleep apnea) 06/29/2019   Premature ejaculation 08/25/2021   Seborrheic dermatitis 08/25/2021   Elevated BP without diagnosis of hypertension 02/23/2022   Tremor 04/17/2023   Resolved Ambulatory Problems    Diagnosis Date Noted   Colon cancer screening 07/12/2016   Nausea 12/26/2018   Bilateral elbow joint pain 06/29/2019   Lateral epicondylitis of left elbow 08/08/2020   Past Medical History:  Diagnosis Date   Benign localized prostatic hyperplasia with lower urinary tract symptoms (LUTS)    COPD (chronic obstructive pulmonary disease) ([facility])    Diverticulosis of colon    Emphysema, unspecified ([facility]) 01/2021   GERD (gastroesophageal  reflux disease)    History of adenomatous polyp of colon    History of pneumothorax    Prostate cancer ([facility]) dx 02-26-2016 via bx--  urologist-  dr Berneice Heinrich  oncologist-  dr Kathrynn Running    Family History  Problem Relation Age of Onset   Lung cancer Mother 58   Hyperlipidemia Mother    Hypertension Mother    Thyroid cancer Sister 13       thyroid/evironmental    Social History   Socioeconomic History   Marital status: Divorced    Spouse name: Not on file   Number of children: Not on file   Years of education: Not on file   Highest education level: Not on file  Occupational History   Not on file  Tobacco Use   Smoking status: Former    Current packs/day: 0.00    Average packs/day: 1 pack/day for 38.0 years (38.0 ttl pk-yrs)    Types: Cigarettes    Start date: 18    Quit date: 2014    Years since quitting: 10.7   Smokeless tobacco: Never  Vaping Use   Vaping status: Never Used  Substance and Sexual Activity   Alcohol use: Yes    Alcohol/week: 1.0 standard drink of alcohol    Types: 1 Cans of beer per week    Comment: FEW BEERS WEEK; consumed 1 beer on 11/13/2018   Drug use: No   Sexual activity: Not on file  Other Topics Concern   Not on file  Social History Narrative   Not on file   Social Determinants of Health   Financial Resource Strain: Not on file  Food Insecurity: Not on file  Transportation Needs: Not on file  Physical Activity: Not on file  Stress: Not on file  Social Connections: Not on file  Intimate Partner Violence: Not on file    ROS  General:  Negative for nexplained weight loss, fever Skin: Negative for new or changing mole, sore that won't heal HEENT: Negative for trouble hearing, trouble seeing, ringing in ears, mouth sores, hoarseness, change in voice, dysphagia. CV:  Negative for chest pain, dyspnea, edema, palpitations Resp: Negative for cough, dyspnea, hemoptysis GI: Positive for hematochezia, negative for nausea, vomiting, diarrhea,  constipation, abdominal pain, melena. GU: Negative for dysuria, incontinence, urinary hesitance, hematuria, vaginal or penile discharge, polyuria, sexual difficulty, lumps in testicle or breasts MSK: Negative for muscle cramps or aches, joint pain or swelling Neuro: Negative for headaches, weakness, numbness, dizziness, passing out/fainting Psych: Negative for depression, anxiety, memory problems  Objective  Physical Exam Vitals:   04/17/23 0822  BP: 118/78  Pulse: 69  Temp: 97.8 F (36.6 C)  SpO2: 96%    BP Readings from Last 3 Encounters:  04/17/23 118/78  05/28/22 130/70  02/23/22 140/80   Wt Readings from Last 3 Encounters:  04/17/23 160 lb 6.4 oz (72.8 kg)  05/28/22 164 lb (74.4 kg)  02/23/22 156 lb 12.8 oz (71.1 kg)    Physical Exam Constitutional:      General: He is not in acute distress.    Appearance: He is not diaphoretic.  HENT:     Head: Normocephalic and atraumatic.  Cardiovascular:     Rate and Rhythm: Normal rate and regular rhythm.     Heart sounds: Normal heart sounds.  Pulmonary:     Effort: Pulmonary effort is normal.     Breath sounds: Normal breath sounds.  Abdominal:     General: Bowel sounds are normal. There is no distension.     Palpations: Abdomen is soft.     Tenderness: There is no abdominal tenderness.  Musculoskeletal:     Right lower leg: No edema.     Left lower leg: No edema.  Lymphadenopathy:     Cervical: No cervical adenopathy.  Skin:    General: Skin is warm and dry.  Neurological:     Mental Status: He is alert.     Comments: Tremor with thumb noted, does not occur at rest, no cogwheel rigidity in either upper extremity, 5/5 strength in bilateral biceps, triceps, grip, quads, hamstrings, plantar and dorsiflexion, sensation to light touch intact in bilateral UE and LE, normal gait  Psychiatric:        Mood and Affect: Mood normal.      Assessment/Plan:   Encounter for general adult medical examination with abnormal  findings Assessment & Plan: Physical exam completed.  Encouraged healthy diet and exercise.  GI referral placed for colon cancer screening.  Patient declines flu and further COVID vaccinations.  Patient is up-to-date on lung cancer screening.  I encouraged him to see ophthalmologist.  Patient declines HIV screening noting that he is low risk.  Lab work as outlined.   Tremor Assessment & Plan: Suspect essential tremor based on history and exam.  Discussed risk of medication management of this would likely outweigh any benefit he would get right now.  We will check a TSH.  He will monitor.   Hypothyroidism, unspecified type -     TSH  Pure hypercholesterolemia -     Comprehensive metabolic panel -     Lipid panel  Overweight -  Hemoglobin A1c  Colon cancer screening -     Ambulatory referral to Gastroenterology    Return in about 6 months (around 10/15/2023).   Marikay Alar, MD [facility]

## 2023-04-23 ENCOUNTER — Telehealth: Payer: Self-pay | Admitting: Family Medicine

## 2023-04-23 NOTE — Telephone Encounter (Signed)
Patient dropped off paperwork to be filled out by provider, please call patient when ready for pick up.

## 2023-04-24 ENCOUNTER — Telehealth: Payer: Self-pay

## 2023-04-24 NOTE — Telephone Encounter (Signed)
Pt is a returning a call to cma

## 2023-04-24 NOTE — Telephone Encounter (Signed)
Please see results note.

## 2023-04-24 NOTE — Telephone Encounter (Signed)
Lvm for pt to give office a call back in regards to labs and mychart message

## 2023-04-24 NOTE — Telephone Encounter (Signed)
-----   Message from Marikay Alar sent at 04/24/2023  9:26 AM EDT ----- Please relay the MyChart result message to the patient.  Thanks.

## 2023-04-26 NOTE — Telephone Encounter (Signed)
I do not see this in my basket. Can you find this?

## 2023-05-02 NOTE — Telephone Encounter (Signed)
I just faxed it and received the okay confirmation.

## 2023-05-24 ENCOUNTER — Other Ambulatory Visit: Payer: Self-pay | Admitting: Family Medicine

## 2023-07-25 DIAGNOSIS — D225 Melanocytic nevi of trunk: Secondary | ICD-10-CM | POA: Diagnosis not present

## 2023-07-25 DIAGNOSIS — L814 Other melanin hyperpigmentation: Secondary | ICD-10-CM | POA: Diagnosis not present

## 2023-07-25 DIAGNOSIS — D2261 Melanocytic nevi of right upper limb, including shoulder: Secondary | ICD-10-CM | POA: Diagnosis not present

## 2023-07-25 DIAGNOSIS — D2262 Melanocytic nevi of left upper limb, including shoulder: Secondary | ICD-10-CM | POA: Diagnosis not present

## 2023-08-30 ENCOUNTER — Other Ambulatory Visit: Payer: Self-pay | Admitting: Family Medicine

## 2023-10-15 ENCOUNTER — Ambulatory Visit: Payer: Self-pay | Admitting: Nurse Practitioner

## 2023-10-15 ENCOUNTER — Encounter: Payer: Self-pay | Admitting: Nurse Practitioner

## 2023-10-15 ENCOUNTER — Ambulatory Visit: Payer: Self-pay | Admitting: Family Medicine

## 2023-10-15 VITALS — BP 124/72 | HR 71 | Temp 98.1°F | Ht 67.0 in | Wt 160.0 lb

## 2023-10-15 DIAGNOSIS — R7303 Prediabetes: Secondary | ICD-10-CM | POA: Insufficient documentation

## 2023-10-15 DIAGNOSIS — L729 Follicular cyst of the skin and subcutaneous tissue, unspecified: Secondary | ICD-10-CM | POA: Insufficient documentation

## 2023-10-15 DIAGNOSIS — E7800 Pure hypercholesterolemia, unspecified: Secondary | ICD-10-CM | POA: Diagnosis not present

## 2023-10-15 DIAGNOSIS — E039 Hypothyroidism, unspecified: Secondary | ICD-10-CM

## 2023-10-15 NOTE — Assessment & Plan Note (Signed)
 His hypothyroidism is well-managed with levothyroxine, and normal TSH levels indicate stable thyroid function. Continue levothyroxine 112 mcg daily as prescribed.

## 2023-10-15 NOTE — Assessment & Plan Note (Signed)
 He is on Crestor with good tolerance and no adverse symptoms reported. Continue Crestor as prescribed. We will recheck lipid panel at next appointment.

## 2023-10-15 NOTE — Assessment & Plan Note (Signed)
 A1c has increased to the prediabetic range at 5.8. He was informed about dietary modifications to manage blood glucose. Advise reducing carbohydrate and sugar intake. Recheck A1c at the annual visit in September.

## 2023-10-15 NOTE — Assessment & Plan Note (Signed)
 There is a cyst under his left ear with slight enlargement but no pain. He is following up with a dermatologist for evaluation.

## 2023-10-15 NOTE — Progress Notes (Signed)
 Cory Dicker, NP-C Phone: [phone]  Cory [name] is a 63 y.o. male who presents today for transfer of care.   Discussed the use of AI scribe software for clinical note transcription with the patient, who gave verbal consent to proceed.  History of Present Illness   Cory [name] "TOM" is a 63 year old male who presents for a transfer of care visit.  He has hypothyroidism and is taking levothyroxine daily. No new symptoms have developed since the last visit. No issues with skin, hair, or nails, except for some dry skin attributed to sun exposure. No heart palpitations or temperature intolerances. His last TSH level was normal.  He is on Crestor for cholesterol management. No abdominal pain or muscle aches, which are potential side effects of statins.  Recent labs indicated an increase in A1c to the prediabetic range.  He has a cyst under his left ear that has been present for two to three years, with a slight increase in size but no pain.  He sees a psychiatrist for quetiapine, which he takes to aid sleep, as he wakes up at 2 AM without it. He occasionally takes Paxil and confirms taking Seroquel at bedtime.  He mentions being due for a colonoscopy but lacks someone to accompany him.      Social History   Tobacco Use  Smoking Status Former   Current packs/day: 0.00   Average packs/day: 1 pack/day for 38.0 years (38.0 ttl pk-yrs)   Types: Cigarettes   Start date: 33   Quit date: 2014   Years since quitting: 11.2  Smokeless Tobacco Never    Current Outpatient Medications on File Prior to Visit  Medication Sig Dispense Refill   famciclovir (FAMVIR) 500 MG tablet Take 1 tablet (500 mg total) by mouth 2 (two) times daily. For 7 days at first sign of cold sore. 14 tablet 1   levothyroxine (SYNTHROID) 112 MCG tablet TAKE 1 TABLET BY MOUTH EVERY DAY 90 tablet 1   meloxicam (MOBIC) 7.5 MG tablet Take 7.5 mg by mouth daily.     PARoxetine (PAXIL) 20 MG tablet Take 20  mg by mouth daily as needed (ED).     QUEtiapine (SEROQUEL) 100 MG tablet Take 100 mg by mouth at bedtime.     rosuvastatin (CRESTOR) 40 MG tablet TAKE 1 TABLET BY MOUTH EVERY DAY 90 tablet 0   tadalafil (CIALIS) 20 MG tablet Take 20 mg by mouth daily.     No current facility-administered medications on file prior to visit.     ROS see history of present illness  Objective  Physical Exam Vitals:   10/15/23 0944  BP: 124/72  Pulse: 71  Temp: 98.1 F (36.7 C)  SpO2: 97%    BP Readings from Last 3 Encounters:  10/15/23 124/72  04/17/23 118/78  05/28/22 130/70   Wt Readings from Last 3 Encounters:  10/15/23 160 lb (72.6 kg)  04/17/23 160 lb 6.4 oz (72.8 kg)  05/28/22 164 lb (74.4 kg)    Physical Exam Constitutional:      General: He is not in acute distress.    Appearance: Normal appearance.  HENT:     Head: Normocephalic.  Cardiovascular:     Rate and Rhythm: Normal rate and regular rhythm.     Heart sounds: Normal heart sounds.  Pulmonary:     Effort: Pulmonary effort is normal.     Breath sounds: Normal breath sounds.  Skin:    General: Skin is warm and dry.  Findings: Lesion present.     Comments: Noted below left ear lobe, see picture below. No tenderness, erythema or drainage.   Neurological:     General: No focal deficit present.     Mental Status: He is alert.  Psychiatric:        Mood and Affect: Mood normal.        Behavior: Behavior normal.      Assessment/Plan: Please see individual problem list.  Cyst of skin Assessment & Plan: There is a cyst under his left ear with slight enlargement but no pain. He is following up with a dermatologist for evaluation.    Hypothyroidism, unspecified type Assessment & Plan: His hypothyroidism is well-managed with levothyroxine, and normal TSH levels indicate stable thyroid function. Continue levothyroxine 112 mcg daily as prescribed.   Pure hypercholesterolemia Assessment & Plan: He is on Crestor  with good tolerance and no adverse symptoms reported. Continue Crestor as prescribed. We will recheck lipid panel at next appointment.    Prediabetes Assessment & Plan: A1c has increased to the prediabetic range at 5.8. He was informed about dietary modifications to manage blood glucose. Advise reducing carbohydrate and sugar intake. Recheck A1c at the annual visit in September.     Return in about 6 months (around 04/16/2024) for Annual Exam, sooner as needed.   Cory Dicker, NP-C [facility]

## 2023-11-01 ENCOUNTER — Other Ambulatory Visit: Payer: Self-pay

## 2023-11-01 MED ORDER — LEVOTHYROXINE SODIUM 112 MCG PO TABS: 112.0000 ug | ORAL_TABLET | Freq: Every day | ORAL | 3 refills | Status: DC

## 2023-11-13 DIAGNOSIS — C61 Malignant neoplasm of prostate: Secondary | ICD-10-CM | POA: Diagnosis not present

## 2023-11-19 DIAGNOSIS — R3915 Urgency of urination: Secondary | ICD-10-CM | POA: Diagnosis not present

## 2023-11-19 DIAGNOSIS — F524 Premature ejaculation: Secondary | ICD-10-CM | POA: Diagnosis not present

## 2023-11-19 DIAGNOSIS — N5201 Erectile dysfunction due to arterial insufficiency: Secondary | ICD-10-CM | POA: Diagnosis not present

## 2023-11-19 DIAGNOSIS — C61 Malignant neoplasm of prostate: Secondary | ICD-10-CM | POA: Diagnosis not present

## 2023-12-26 ENCOUNTER — Encounter: Payer: Self-pay | Admitting: Nurse Practitioner

## 2023-12-26 DIAGNOSIS — E7800 Pure hypercholesterolemia, unspecified: Secondary | ICD-10-CM

## 2023-12-26 MED ORDER — ROSUVASTATIN CALCIUM 40 MG PO TABS: 40.0000 mg | ORAL_TABLET | Freq: Every day | ORAL | 3 refills | Status: AC

## 2023-12-26 MED ORDER — FAMCICLOVIR 500 MG PO TABS: 500.0000 mg | ORAL_TABLET | Freq: Two times a day (BID) | ORAL | 1 refills | Status: DC

## 2024-01-23 DIAGNOSIS — F32A Depression, unspecified: Secondary | ICD-10-CM | POA: Diagnosis not present

## 2024-02-11 DIAGNOSIS — M25511 Pain in right shoulder: Secondary | ICD-10-CM | POA: Diagnosis not present

## 2024-02-25 ENCOUNTER — Ambulatory Visit
Admission: RE | Admit: 2024-02-25 | Discharge: 2024-02-25 | Disposition: A | Discharge status: Home / Self Care | Source: Ambulatory Visit | Attending: Acute Care | Admitting: Acute Care

## 2024-02-25 DIAGNOSIS — Z87891 Personal history of nicotine dependence: Secondary | ICD-10-CM | POA: Insufficient documentation

## 2024-02-25 DIAGNOSIS — Z122 Encounter for screening for malignant neoplasm of respiratory organs: Secondary | ICD-10-CM | POA: Diagnosis not present

## 2024-03-05 DIAGNOSIS — F331 Major depressive disorder, recurrent, moderate: Secondary | ICD-10-CM | POA: Diagnosis not present

## 2024-03-06 ENCOUNTER — Telehealth: Payer: Self-pay

## 2024-03-06 ENCOUNTER — Other Ambulatory Visit: Payer: Self-pay

## 2024-03-06 DIAGNOSIS — F17210 Nicotine dependence, cigarettes, uncomplicated: Secondary | ICD-10-CM

## 2024-03-06 DIAGNOSIS — Z87891 Personal history of nicotine dependence: Secondary | ICD-10-CM

## 2024-03-06 DIAGNOSIS — Z122 Encounter for screening for malignant neoplasm of respiratory organs: Secondary | ICD-10-CM

## 2024-03-06 DIAGNOSIS — R911 Solitary pulmonary nodule: Secondary | ICD-10-CM

## 2024-03-06 NOTE — Telephone Encounter (Signed)
 Spoke with patient and reviewed recent Lung CT results. He is in agreement to complete a f/u Lung CT in 6 months to evaluate a new 5.27mm nodule. Scheduled for 08/29/2023 at [facility]. Order placed. Results and plan to PCP.   IMPRESSION: 1. Lung-RADS 3, probably benign findings. Short-term follow-up in 6 months is recommended with repeat low-dose chest CT without contrast (please use the following order, CT CHEST LCS NODULE FOLLOW-UP W/O CM). 2. Aortic Atherosclerosis (ICD10-I70.0) and Emphysema (ICD10-J43.9). Coronary artery calcifications. Assessment for potential risk factor modification, dietary therapy or pharmacologic therapy may be warranted, if clinically indicated.

## 2024-04-07 DIAGNOSIS — F331 Major depressive disorder, recurrent, moderate: Secondary | ICD-10-CM | POA: Diagnosis not present

## 2024-04-07 DIAGNOSIS — F411 Generalized anxiety disorder: Secondary | ICD-10-CM | POA: Diagnosis not present

## 2024-04-15 DIAGNOSIS — F900 Attention-deficit hyperactivity disorder, predominantly inattentive type: Secondary | ICD-10-CM | POA: Diagnosis not present

## 2024-04-22 ENCOUNTER — Encounter: Admitting: Nurse Practitioner

## 2024-04-24 ENCOUNTER — Encounter: Payer: Self-pay | Admitting: Gastroenterology

## 2024-05-05 DIAGNOSIS — F32A Depression, unspecified: Secondary | ICD-10-CM | POA: Diagnosis not present

## 2024-05-05 DIAGNOSIS — F900 Attention-deficit hyperactivity disorder, predominantly inattentive type: Secondary | ICD-10-CM | POA: Diagnosis not present

## 2024-06-03 DIAGNOSIS — F3181 Bipolar II disorder: Secondary | ICD-10-CM | POA: Diagnosis not present

## 2024-06-15 DIAGNOSIS — C61 Malignant neoplasm of prostate: Secondary | ICD-10-CM | POA: Diagnosis not present

## 2024-06-15 DIAGNOSIS — N5201 Erectile dysfunction due to arterial insufficiency: Secondary | ICD-10-CM | POA: Diagnosis not present

## 2024-06-16 NOTE — Telephone Encounter (Signed)
 open in error

## 2024-06-22 DIAGNOSIS — N5201 Erectile dysfunction due to arterial insufficiency: Secondary | ICD-10-CM | POA: Diagnosis not present

## 2024-07-04 ENCOUNTER — Other Ambulatory Visit: Payer: Self-pay | Admitting: Nurse Practitioner

## 2024-07-10 ENCOUNTER — Ambulatory Visit: Admitting: Nurse Practitioner

## 2024-07-10 VITALS — BP 110/70 | HR 71 | Temp 97.6°F | Ht 67.0 in | Wt 155.0 lb

## 2024-07-10 DIAGNOSIS — E039 Hypothyroidism, unspecified: Secondary | ICD-10-CM | POA: Diagnosis not present

## 2024-07-10 DIAGNOSIS — Z1211 Encounter for screening for malignant neoplasm of colon: Secondary | ICD-10-CM

## 2024-07-10 DIAGNOSIS — Z0000 Encounter for general adult medical examination without abnormal findings: Secondary | ICD-10-CM

## 2024-07-10 DIAGNOSIS — E7800 Pure hypercholesterolemia, unspecified: Secondary | ICD-10-CM | POA: Diagnosis not present

## 2024-07-10 DIAGNOSIS — R7303 Prediabetes: Secondary | ICD-10-CM | POA: Diagnosis not present

## 2024-07-10 DIAGNOSIS — Z8546 Personal history of malignant neoplasm of prostate: Secondary | ICD-10-CM

## 2024-07-10 NOTE — Progress Notes (Unsigned)
 Leron Glance, NP-C Phone: [phone]  Cory [name] is a 63 y.o. male who presents today for annual exam.   ***  Tobacco Use History[1]  Medications Ordered Prior to Encounter[2]   ROS see history of present illness  Objective  Physical Exam Vitals:   07/10/24 1325  BP: 110/70  Pulse: 71  Temp: 97.6 F (36.4 C)  SpO2: 94%    BP Readings from Last 3 Encounters:  07/10/24 110/70  10/15/23 124/72  04/17/23 118/78   Wt Readings from Last 3 Encounters:  07/10/24 155 lb (70.3 kg)  10/15/23 160 lb (72.6 kg)  04/17/23 160 lb 6.4 oz (72.8 kg)    Physical Exam Constitutional:      General: He is not in acute distress.    Appearance: Normal appearance.  HENT:     Head: Normocephalic.     Right Ear: Tympanic membrane normal.     Left Ear: Tympanic membrane normal.     Nose: Nose normal.     Mouth/Throat:     Mouth: Mucous membranes are moist.     Pharynx: Oropharynx is clear.  Eyes:     Conjunctiva/sclera: Conjunctivae normal.     Pupils: Pupils are equal, round, and reactive to light.  Neck:     Thyroid : No thyromegaly.  Cardiovascular:     Rate and Rhythm: Normal rate and regular rhythm.     Heart sounds: Normal heart sounds.  Pulmonary:     Effort: Pulmonary effort is normal.     Breath sounds: Normal breath sounds.  Abdominal:     General: Abdomen is flat. Bowel sounds are normal.     Palpations: Abdomen is soft. There is no mass.     Tenderness: There is no abdominal tenderness.  Musculoskeletal:        General: Normal range of motion.  Lymphadenopathy:     Cervical: No cervical adenopathy.  Skin:    General: Skin is warm and dry.     Findings: No rash.  Neurological:     General: No focal deficit present.     Mental Status: He is alert.  Psychiatric:        Mood and Affect: Mood normal.        Behavior: Behavior normal.      Assessment/Plan: Please see individual problem list.  Routine general medical examination at a health care  facility -     CBC with Differential/Platelet  Hypothyroidism, unspecified type -     TSH  Pure hypercholesterolemia -     Lipid panel -     Comprehensive metabolic panel with GFR  Prediabetes -     Hemoglobin A1c  Screen for colon cancer -     Ambulatory referral to Gastroenterology     Return in about 1 year (around 07/10/2025) for Annual Exam, sooner as needed.   Leron Glance, NP-C [facility] [place]    [1]  Social History Tobacco Use  Smoking Status Former   Current packs/day: 0.00   Average packs/day: 1 pack/day for 38.0 years (38.0 ttl pk-yrs)   Types: Cigarettes   Start date: 11   Quit date: 2014   Years since quitting: 11.9  Smokeless Tobacco Never  [2]  Current Outpatient Medications on File Prior to Visit  Medication Sig Dispense Refill   famciclovir  (FAMVIR ) 500 MG tablet TAKE 1 TABLET (500 MG TOTAL) BY MOUTH 2 (TWO) TIMES DAILY. FOR 7 DAYS AT FIRST SIGN OF COLD SORE. 14 tablet 5  levothyroxine  (SYNTHROID ) 112 MCG tablet Take 1 tablet (112 mcg total) by mouth daily. 90 tablet 3   lisdexamfetamine (VYVANSE) 40 MG capsule Take 40 mg by mouth every morning.     meloxicam (MOBIC) 7.5 MG tablet Take 7.5 mg by mouth daily.     PARoxetine (PAXIL) 20 MG tablet Take 20 mg by mouth daily as needed (ED).     QUEtiapine (SEROQUEL) 100 MG tablet Take 100 mg by mouth at bedtime.     rosuvastatin  (CRESTOR ) 40 MG tablet Take 1 tablet (40 mg total) by mouth daily. 90 tablet 3   No current facility-administered medications on file prior to visit.

## 2024-07-11 LAB — CBC WITH DIFFERENTIAL/PLATELET
Basophils Absolute: 0 x10E3/uL (ref 0.0–0.2)
Basos: 1 %
EOS (ABSOLUTE): 0.1 x10E3/uL (ref 0.0–0.4)
Eos: 1 %
Hematocrit: 47.8 % (ref 37.5–51.0)
Hemoglobin: 15.8 g/dL (ref 13.0–17.7)
Immature Grans (Abs): 0 x10E3/uL (ref 0.0–0.1)
Immature Granulocytes: 0 %
Lymphocytes Absolute: 1.7 x10E3/uL (ref 0.7–3.1)
Lymphs: 29 %
MCH: 30.4 pg (ref 26.6–33.0)
MCHC: 33.1 g/dL (ref 31.5–35.7)
MCV: 92 fL (ref 79–97)
Monocytes Absolute: 0.8 x10E3/uL (ref 0.1–0.9)
Monocytes: 13 %
Neutrophils Absolute: 3.4 x10E3/uL (ref 1.4–7.0)
Neutrophils: 56 %
Platelets: 219 x10E3/uL (ref 150–450)
RBC: 5.19 x10E6/uL (ref 4.14–5.80)
RDW: 13 % (ref 11.6–15.4)
WBC: 5.9 x10E3/uL (ref 3.4–10.8)

## 2024-07-11 LAB — COMPREHENSIVE METABOLIC PANEL WITH GFR
ALT: 36 IU/L (ref 0–44)
AST: 31 IU/L (ref 0–40)
Albumin: 4.5 g/dL (ref 3.9–4.9)
Alkaline Phosphatase: 101 IU/L (ref 47–123)
BUN/Creatinine Ratio: 15 (ref 10–24)
BUN: 16 mg/dL (ref 8–27)
Bilirubin Total: 1.8 mg/dL — ABNORMAL HIGH (ref 0.0–1.2)
CO2: 22 mmol/L (ref 20–29)
Calcium: 9.7 mg/dL (ref 8.6–10.2)
Chloride: 101 mmol/L (ref 96–106)
Creatinine, Ser: 1.07 mg/dL (ref 0.76–1.27)
Globulin, Total: 2.2 g/dL (ref 1.5–4.5)
Glucose: 87 mg/dL (ref 70–99)
Potassium: 4.7 mmol/L (ref 3.5–5.2)
Sodium: 138 mmol/L (ref 134–144)
Total Protein: 6.7 g/dL (ref 6.0–8.5)
eGFR: 78 mL/min/1.73 (ref >59)

## 2024-07-11 LAB — LIPID PANEL
Chol/HDL Ratio: 2.9 ratio (ref 0.0–5.0)
Cholesterol, Total: 144 mg/dL (ref 100–199)
HDL: 50 mg/dL (ref >39)
LDL Chol Calc (NIH): 73 mg/dL (ref 0–99)
Triglycerides: 115 mg/dL (ref 0–149)
VLDL Cholesterol Cal: 21 mg/dL (ref 5–40)

## 2024-07-11 LAB — TSH: TSH: 0.277 u[IU]/mL — ABNORMAL LOW (ref 0.450–4.500)

## 2024-07-11 LAB — HEMOGLOBIN A1C
Est. average glucose Bld gHb Est-mCnc: 114 mg/dL
Hgb A1c MFr Bld: 5.6 % (ref 4.8–5.6)

## 2024-07-21 ENCOUNTER — Encounter: Payer: Self-pay | Admitting: Nurse Practitioner

## 2024-07-21 ENCOUNTER — Ambulatory Visit: Payer: Self-pay | Admitting: Nurse Practitioner

## 2024-07-21 DIAGNOSIS — E039 Hypothyroidism, unspecified: Secondary | ICD-10-CM

## 2024-07-21 MED ORDER — LEVOTHYROXINE SODIUM 100 MCG PO TABS: 100.0000 ug | ORAL_TABLET | Freq: Every day | ORAL | 0 refills | Status: AC

## 2024-07-21 NOTE — Assessment & Plan Note (Signed)
 Physical exam complete. We will check lab work as outlined. Colonoscopy past due, referral placed to GI. PSA screening is up to date, Hx prostate cancer, managed by Urology. Declines flu and additional COVID vaccines. Tetanus vaccine is up to date. He has completed the Shingles and Pneumonia vaccine series. Continue routine dental and eye exams. Encourage healthy diet and regular exercise. Return to care in one year, sooner as needed.

## 2024-07-21 NOTE — Assessment & Plan Note (Signed)
 Check A1c. Encourage healthy diet and regular exercise.

## 2024-07-21 NOTE — Assessment & Plan Note (Signed)
 Well-managed on levothyroxine  with no symptoms. Continue current dose of levothyroxine . Check TSH.

## 2024-07-21 NOTE — Telephone Encounter (Signed)
 Pt notified of result and lab appt scheduled to repeat TSH in 2 months.  Pt states that he was taking his medication at bedtime on a empty stomach because he was never told when to take it.  I have advised patient that it is recommended to take first thing in the morning on a empty stomach with no other medications.

## 2024-07-21 NOTE — Assessment & Plan Note (Signed)
 Monitored by urology. Follow up as scheduled.

## 2024-07-21 NOTE — Assessment & Plan Note (Signed)
 He is on Crestor  with good tolerance and no adverse symptoms reported. Continue Crestor  as prescribed. Check lipid panel.

## 2024-07-27 ENCOUNTER — Encounter: Payer: Self-pay | Admitting: Gastroenterology

## 2024-07-28 DIAGNOSIS — L814 Other melanin hyperpigmentation: Secondary | ICD-10-CM | POA: Diagnosis not present

## 2024-07-28 DIAGNOSIS — L918 Other hypertrophic disorders of the skin: Secondary | ICD-10-CM | POA: Diagnosis not present

## 2024-07-28 DIAGNOSIS — L720 Epidermal cyst: Secondary | ICD-10-CM | POA: Diagnosis not present

## 2024-07-28 DIAGNOSIS — L821 Other seborrheic keratosis: Secondary | ICD-10-CM | POA: Diagnosis not present

## 2024-07-28 DIAGNOSIS — D225 Melanocytic nevi of trunk: Secondary | ICD-10-CM | POA: Diagnosis not present

## 2024-08-28 ENCOUNTER — Ambulatory Visit
Admission: RE | Admit: 2024-08-28 | Discharge: 2024-08-28 | Disposition: A | Source: Ambulatory Visit | Attending: Acute Care | Admitting: Acute Care

## 2024-08-28 DIAGNOSIS — F17210 Nicotine dependence, cigarettes, uncomplicated: Secondary | ICD-10-CM | POA: Diagnosis present

## 2024-08-28 DIAGNOSIS — Z87891 Personal history of nicotine dependence: Secondary | ICD-10-CM | POA: Diagnosis present

## 2024-08-28 DIAGNOSIS — R911 Solitary pulmonary nodule: Secondary | ICD-10-CM | POA: Insufficient documentation

## 2024-08-28 DIAGNOSIS — Z122 Encounter for screening for malignant neoplasm of respiratory organs: Secondary | ICD-10-CM | POA: Diagnosis present

## 2024-09-02 ENCOUNTER — Other Ambulatory Visit: Payer: Self-pay

## 2024-09-02 DIAGNOSIS — Z87891 Personal history of nicotine dependence: Secondary | ICD-10-CM

## 2024-09-02 DIAGNOSIS — Z122 Encounter for screening for malignant neoplasm of respiratory organs: Secondary | ICD-10-CM

## 2024-09-03 ENCOUNTER — Encounter: Payer: Self-pay | Admitting: Gastroenterology

## 2024-09-03 ENCOUNTER — Ambulatory Visit

## 2024-09-03 VITALS — Ht 67.0 in | Wt 150.0 lb

## 2024-09-03 DIAGNOSIS — Z860100 Personal history of colon polyps, unspecified: Secondary | ICD-10-CM

## 2024-09-03 MED ORDER — NA SULFATE-K SULFATE-MG SULF 17.5-3.13-1.6 GM/177ML PO SOLN: 1.0000 | Freq: Once | ORAL | 0 refills | Status: AC

## 2024-09-03 NOTE — Progress Notes (Signed)
 No egg or soy allergy known to patient  No issues known to pt with past sedation with any surgeries or procedures Patient denies ever being told they had issues or difficulty with intubation  No FH of Malignant Hyperthermia Pt is not on diet pills Pt is not on  home 02  Pt is not on blood thinners  Pt denies issues with constipation- sometimes  No A fib or A flutter Have any cardiac testing pending--NO Pt can ambulate- INDEPENDENTLY  Pt denies use of chewing tobacco Discussed diabetic I weight loss medication holds Discussed NSAID holds Checked BMI Pt instructed to use [employer] or GoodRx for a price reduction on prep  Patient's chart reviewed by Norleen Schillings CNRA prior to previsit and patient appropriate for the [facility].  Pre visit completed and red dot placed by patient's name on their procedure day (on provider's schedule).

## 2024-09-17 ENCOUNTER — Encounter: Admitting: Gastroenterology

## 2024-09-21 ENCOUNTER — Other Ambulatory Visit

## 2025-07-13 ENCOUNTER — Encounter: Admitting: Nurse Practitioner
